# Patient Record
Sex: Female | Born: 1970 | Race: White | Hispanic: No | Marital: Single | Smoking: Current every day smoker
Health system: Southern US, Community
[De-identification: ages and names within clinical notes are randomized; demographics above are authoritative.]

## PROBLEM LIST (undated history)

## (undated) DIAGNOSIS — F32A Depression, unspecified: Secondary | ICD-10-CM

## (undated) DIAGNOSIS — K30 Functional dyspepsia: Secondary | ICD-10-CM

## (undated) DIAGNOSIS — H9190 Unspecified hearing loss, unspecified ear: Secondary | ICD-10-CM

## (undated) DIAGNOSIS — M1990 Unspecified osteoarthritis, unspecified site: Secondary | ICD-10-CM

## (undated) DIAGNOSIS — K219 Gastro-esophageal reflux disease without esophagitis: Secondary | ICD-10-CM

## (undated) DIAGNOSIS — K529 Noninfective gastroenteritis and colitis, unspecified: Secondary | ICD-10-CM

## (undated) DIAGNOSIS — F329 Major depressive disorder, single episode, unspecified: Secondary | ICD-10-CM

## (undated) DIAGNOSIS — E785 Hyperlipidemia, unspecified: Secondary | ICD-10-CM

## (undated) DIAGNOSIS — R9389 Abnormal findings on diagnostic imaging of other specified body structures: Secondary | ICD-10-CM

## (undated) DIAGNOSIS — F419 Anxiety disorder, unspecified: Secondary | ICD-10-CM

## (undated) DIAGNOSIS — K859 Acute pancreatitis without necrosis or infection, unspecified: Secondary | ICD-10-CM

## (undated) DIAGNOSIS — L309 Dermatitis, unspecified: Secondary | ICD-10-CM

## (undated) DIAGNOSIS — K589 Irritable bowel syndrome without diarrhea: Secondary | ICD-10-CM

## (undated) HISTORY — DX: Major depressive disorder, single episode, unspecified: F32.9

## (undated) HISTORY — PX: ANTERIOR CRUCIATE LIGAMENT REPAIR: SHX115

## (undated) HISTORY — DX: Hyperlipidemia, unspecified: E78.5

## (undated) HISTORY — DX: Anxiety disorder, unspecified: F41.9

## (undated) HISTORY — DX: Depression, unspecified: F32.A

## (undated) HISTORY — DX: Gastro-esophageal reflux disease without esophagitis: K21.9

## (undated) HISTORY — DX: Unspecified osteoarthritis, unspecified site: M19.90

## (undated) HISTORY — DX: Acute pancreatitis without necrosis or infection, unspecified: K85.90

---

## 2007-09-11 ENCOUNTER — Encounter: Payer: Self-pay | Admitting: Family Medicine

## 2010-03-18 ENCOUNTER — Ambulatory Visit: Payer: Self-pay | Admitting: Family Medicine

## 2010-03-18 ENCOUNTER — Other Ambulatory Visit: Admission: RE | Admit: 2010-03-18 | Discharge: 2010-03-18 | Payer: Self-pay | Admitting: Family Medicine

## 2010-03-18 DIAGNOSIS — M2550 Pain in unspecified joint: Secondary | ICD-10-CM

## 2010-03-18 DIAGNOSIS — M1990 Unspecified osteoarthritis, unspecified site: Secondary | ICD-10-CM | POA: Insufficient documentation

## 2010-03-18 DIAGNOSIS — F329 Major depressive disorder, single episode, unspecified: Secondary | ICD-10-CM

## 2010-03-18 DIAGNOSIS — H6120 Impacted cerumen, unspecified ear: Secondary | ICD-10-CM | POA: Insufficient documentation

## 2010-03-18 DIAGNOSIS — H9190 Unspecified hearing loss, unspecified ear: Secondary | ICD-10-CM | POA: Insufficient documentation

## 2010-03-18 DIAGNOSIS — K219 Gastro-esophageal reflux disease without esophagitis: Secondary | ICD-10-CM | POA: Insufficient documentation

## 2010-03-18 DIAGNOSIS — F411 Generalized anxiety disorder: Secondary | ICD-10-CM | POA: Insufficient documentation

## 2010-03-18 DIAGNOSIS — H538 Other visual disturbances: Secondary | ICD-10-CM

## 2010-03-19 LAB — CONVERTED CEMR LAB
Bilirubin, Direct: 0.1 mg/dL (ref 0.0–0.3)
Calcium: 9.5 mg/dL (ref 8.4–10.5)
Chloride: 106 meq/L (ref 96–112)
Cholesterol: 257 mg/dL — ABNORMAL HIGH (ref 0–200)
Creatinine, Ser: 0.7 mg/dL (ref 0.4–1.2)
Total Bilirubin: 0.3 mg/dL (ref 0.3–1.2)
Total CHOL/HDL Ratio: 8
VLDL: 124.2 mg/dL — ABNORMAL HIGH (ref 0.0–40.0)

## 2010-03-25 ENCOUNTER — Encounter: Payer: Self-pay | Admitting: Family Medicine

## 2010-03-25 LAB — CONVERTED CEMR LAB: Pap Smear: NEGATIVE

## 2010-05-06 ENCOUNTER — Telehealth (INDEPENDENT_AMBULATORY_CARE_PROVIDER_SITE_OTHER): Payer: Self-pay | Admitting: *Deleted

## 2010-05-06 ENCOUNTER — Ambulatory Visit: Payer: Self-pay | Admitting: Family Medicine

## 2010-05-06 DIAGNOSIS — E781 Pure hyperglyceridemia: Secondary | ICD-10-CM | POA: Insufficient documentation

## 2010-05-07 LAB — CONVERTED CEMR LAB
AST: 20 units/L (ref 0–37)
Alkaline Phosphatase: 47 units/L (ref 39–117)
Total Bilirubin: 0.5 mg/dL (ref 0.3–1.2)
Total CHOL/HDL Ratio: 6
VLDL: 89.6 mg/dL — ABNORMAL HIGH (ref 0.0–40.0)

## 2010-05-12 ENCOUNTER — Telehealth: Payer: Self-pay | Admitting: Family Medicine

## 2010-06-09 NOTE — Letter (Signed)
Summary: Results Follow up Letter  [facility]  [address] [place], [place] [address]   Phone: [phone]  Fax: [phone]    03/25/2010 MRN: [id number]  Sherri Dalton [address] [place], [place]  [address]  Dear Ms. Bentson,  The following are the results of your recent test(s):  Test         Result    Pap Smear:        Normal __X___  Not Normal _____ Comments: ______________________________________________________ Cholesterol: LDL(Bad cholesterol):         Your goal is less than:         HDL (Good cholesterol):       Your goal is more than: Comments:  ______________________________________________________ Mammogram:        Normal _____  Not Normal _____ Comments:  ___________________________________________________________________ Hemoccult:        Normal _____  Not normal _______ Comments:    _____________________________________________________________________ Other Tests:    We routinely do not discuss normal results over the telephone.  If you desire a copy of the results, or you have any questions about this information we can discuss them at your next office visit.   Sincerely,      Dr. Ruthe Mannan

## 2010-06-09 NOTE — Assessment & Plan Note (Signed)
Summary: NEW PT TO EST/CPX/CLE   Vital Signs:  Patient profile:   40 year old female Height:      64 inches Weight:      173 pounds BMI:     29.80 Temp:     98.6 degrees F oral Pulse rate:   72 / minute Pulse rhythm:   regular BP sitting:   102 / 70  (right arm) Cuff size:   regular  Vitals Entered By: Linde Gillis CMA Duncan Dull) (March 18, 2010 9:34 AM) CC: new patient, establish care   History of Present Illness: 40 yo here to establish care.  Hearing loss- noticed bilateral hearing loss, getting progressively worse over past 2 years, right >left.  No ringing in ears, ear pain, drainage, fevers or chills.  Does work around loud noises.  Blurred vision- at times, when she changes position quickl, notices blurred vision, dizziness, and nausea.  Resolves on its own within miinutes.  Arthritis- multiple sites due to multiple sports related injuries- hands, knees bilateral, back.  Takes Diclofenac as needed.  Herpes labialis- uses Valtrex as needed, has not had an outbreak in months.   Depression/anxiety- has been on Wellbutrin 300 mg since 1999.  Symptoms well controlled.  No tearfulness, has more energy. No SI or HI.  Well woman- G0 by mouth.  No h/o abrnormal pap smears.    Preventive Screening-Counseling & Management  Alcohol-Tobacco     Smoking Status: current     Smoking Cessation Counseling: yes      Drug Use:  no.    Allergies (verified): No Known Drug Allergies  Past History:  Past Medical History: Anxiety Depression GERD Osteoarthritis  Past Surgical History: ACL left repaired  Family History: Dad died at 25 of prostate CA and cirrhosis of liver.    Social History: Current Smoker Alcohol use-no Drug use-no Smoking Status:  current Drug Use:  no  Review of Systems      See HPI General:  Denies fever and malaise. Eyes:  Complains of blurring; denies discharge, double vision, eye irritation, eye pain, and halos. ENT:  Complains of decreased  hearing; denies difficulty swallowing, ear discharge, and earache. CV:  Denies chest pain or discomfort. Resp:  Denies shortness of breath. GI:  Denies abdominal pain, bloody stools, and change in bowel habits. GU:  Denies abnormal vaginal bleeding, dysuria, genital sores, urinary frequency, and urinary hesitancy. MS:  Complains of joint pain; denies joint redness and joint swelling. Derm:  Denies rash. Neuro:  Denies headaches and numbness. Psych:  Denies panic attacks, sense of great danger, suicidal thoughts/plans, thoughts of violence, unusual visions or sounds, and thoughts /plans of harming others. Endo:  Denies cold intolerance and heat intolerance. Heme:  Denies abnormal bruising and bleeding.  Physical Exam  General:  Well-developed,well-nourished,in no acute distress; alert,appropriate and cooperative throughout examination Head:  normocephalic, atraumatic, and no abnormalities observed.   Eyes:  vision grossly intact, pupils equal, pupils round, and pupils reactive to light.   neg dix hallpike Ears:  cerumen impaction bilaterally- removed with irrigation and metal currette, TMs normal after removal, hearing much improved.   Nose:  no external deformity.   Mouth:  good dentition.   Neck:  No deformities, masses, or tenderness noted. Breasts:  No mass, nodules, thickening, tenderness, bulging, retraction, inflamation, nipple discharge or skin changes noted.   Lungs:  Normal respiratory effort, chest expands symmetrically. Lungs are clear to auscultation, no crackles or wheezes. Heart:  Normal rate and regular rhythm. S1  and S2 normal without gallop, murmur, click, rub or other extra sounds. Abdomen:  Bowel sounds positive,abdomen soft and non-tender without masses, organomegaly or hernias noted. Rectal:  no external abnormalities.   Genitalia:  Pelvic Exam:        External: normal female genitalia without lesions or masses        Vagina: normal without lesions or masses, some  thick white discharge in vault        Cervix: normal without lesions or masses        Adnexa: normal bimanual exam without masses or fullness        Uterus: normal by palpation        Pap smear: performed Msk:  No deformity or scoliosis noted of thoracic or lumbar spine.   Extremities:  no edema Neurologic:  alert & oriented X3 and gait normal.   Skin:  Intact without suspicious lesions or rashes Psych:  Cognition and judgment appear intact. Alert and cooperative with normal attention span and concentration. No apparent delusions, illusions, hallucinations   Impression & Recommendations:  Problem # 1:  HEARING LOSS, BILATERAL (ICD-389.9) Assessment New Lkely due to cerumen impaction, greatly improved after cerumen removal. ADvised her that if hearing worsens again, to return and we will refer to audiology.  Problem # 2:  CERUMEN IMPACTION, BILATERAL (ICD-380.4) Assessment: New See above. Orders: Cerumen Impaction Removal (81191)  Problem # 3:  PREVENTIVE HEALTH CARE (ICD-V70.0) Reviewed preventive care protocols, scheduled due services, and updated immunizations Discussed nutrition, exercise, diet, and healthy lifestyle.  Pap today. BMET, FLP today. Tdap and influenza vaccination today. Orders: Venipuncture (47829) TLB-BMP (Basic Metabolic Panel-BMET) (80048-METABOL)  Problem # 4:  DEPRESSION (ICD-311) Assessment: Unchanged Stable on Wellbutrin 300 mg daily.   Her updated medication list for this problem includes:    Wellbutrin Xl 300 Mg Xr24h-tab (Bupropion hcl) .Marland Kitchen... Take one tablet by mouth daily  Problem # 5:  BLURRED VISION, INTERMITTENT (ICD-368.8) Assessment: New ? vertigo worsened by cerumen impaciton.  Hopefully will improve now that large amounts of cerumen removed. Still advised to see optometrist for dilated eye exam.  Complete Medication List: 1)  Wellbutrin Xl 300 Mg Xr24h-tab (Bupropion hcl) .... Take one tablet by mouth daily 2)  Diclofenac Sodium 50  Mg Tbec (Diclofenac sodium) .... Take 150mg  two times a day 3)  Valacyclovir Hcl 500 Mg Tabs (Valacyclovir hcl) .... 1000mg  as needed 4)  Epipen 2-pak 0.3 Mg/0.58ml Devi (Epinephrine) .... Use as directed  Other Orders: TLB-Lipid Panel (80061-LIPID) TLB-Hepatic/Liver Function Pnl (80076-HEPATIC)  Patient Instructions: 1)  Great to meet you. 2)  Please make an appointment with an eye doctor for a dilated eye exam. Prescriptions: EPIPEN 2-PAK 0.3 MG/0.3ML DEVI (EPINEPHRINE) use as directed  #1 x 1   Entered and Authorized by:   Ruthe Mannan MD   Signed by:   Ruthe Mannan MD on 03/18/2010   Method used:   Print then Give to Patient   RxID:   [id number] VALACYCLOVIR HCL 500 MG TABS (VALACYCLOVIR HCL) 1000mg  as needed  #30 x 0   Entered and Authorized by:   Ruthe Mannan MD   Signed by:   Ruthe Mannan MD on 03/18/2010   Method used:   Print then Give to Patient   RxID:   [id number] DICLOFENAC SODIUM 50 MG TBEC (DICLOFENAC SODIUM) take 150mg  two times a day  #60 x 6   Entered and Authorized by:   Ruthe Mannan MD   Signed by:   Ruthe Mannan  MD on 03/18/2010   Method used:   Print then Give to Patient   RxID:   [id number] WELLBUTRIN XL 300 MG XR24H-TAB (BUPROPION HCL) take one tablet by mouth daily  #30 x 6   Entered and Authorized by:   Ruthe Mannan MD   Signed by:   Ruthe Mannan MD on 03/18/2010   Method used:   Print then Give to Patient   RxID:   [id number]    Orders Added: 1)  TLB-Lipid Panel [80061-LIPID] 2)  Venipuncture [84696] 3)  TLB-BMP (Basic Metabolic Panel-BMET) [80048-METABOL] 4)  TLB-Hepatic/Liver Function Pnl [80076-HEPATIC] 5)  Cerumen Impaction Removal [69210] 6)  Est. Patient Level III [29528] 7)  New Patient 18-39 years [99385]    Current Allergies (reviewed today): No known allergies    Prevention & Chronic Care Immunizations   Influenza vaccine: given  (03/18/2010)   Influenza vaccine due: 03/19/2011    Tetanus booster: 03/18/2010: given    Tetanus booster due: 03/18/2020    Pneumococcal vaccine: Not documented  Other Screening   Pap smear: Not documented   Pap smear action/deferral: Ordered  (03/18/2010)   Smoking status: current  (03/18/2010)   Smoking cessation counseling: yes  (03/18/2010)  Lipids   Total Cholesterol: Not documented   Lipid panel action/deferral: Lipid Panel ordered   LDL: Not documented   LDL Direct: Not documented   HDL: Not documented   Triglycerides: Not documented   Nursing Instructions: Give Flu vaccine today Give tetanus booster today Pap smear today   Flu Vaccine Result Date:  03/18/2010 Flu Vaccine Result:  given TD Result Date:  03/18/2010 TD Result:  given   Appended Document: NEW PT TO EST/CPX/CLE     Allergies: No Known Drug Allergies   Complete Medication List: 1)  Wellbutrin Xl 300 Mg Xr24h-tab (Bupropion hcl) .... Take one tablet by mouth daily 2)  Diclofenac Sodium 50 Mg Tbec (Diclofenac sodium) .... Take 150mg  two times a day 3)  Valacyclovir Hcl 500 Mg Tabs (Valacyclovir hcl) .... 1000mg  as needed 4)  Epipen 2-pak 0.3 Mg/0.65ml Devi (Epinephrine) .... Use as directed  Other Orders: Tdap => 47yrs IM (41324) Admin 1st Vaccine (40102) Flu Vaccine 44yrs + [phone])   Orders Added: 1)  Tdap => 4yrs IM [90715] 2)  Admin 1st Vaccine [90471] 3)  Flu Vaccine 66yrs + [64403]   Immunizations Administered:  Tetanus Vaccine:    Vaccine Type: Tdap    Site: left deltoid    Mfr: GlaxoSmithKline    Dose: 0.5 ml    Route: IM    Given by: Linde Gillis CMA (AAMA)    Exp. Date: 02/27/2012    Lot #: [id number]    VIS given: 03/27/08 version given March 18, 2010.   Immunizations Administered:  Tetanus Vaccine:    Vaccine Type: Tdap    Site: left deltoid    Mfr: GlaxoSmithKline    Dose: 0.5 ml    Route: IM    Given by: Linde Gillis CMA (AAMA)    Exp. Date: 02/27/2012    Lot #: [id number]    VIS given: 03/27/08 version given March 18, 2010.  Flu  Vaccine Consent Questions     Do you have a history of severe allergic reactions to this vaccine? no    Any prior history of allergic reactions to egg and/or gelatin? no    Do you have a sensitivity to the preservative Thimersol? no    Do you have a past history of Guillan-Barre Syndrome? no  Do you currently have an acute febrile illness? no    Have you ever had a severe reaction to latex? no    Vaccine information given and explained to patient? yes    Are you currently pregnant? no    Lot Number:[id number]   Exp Date:11/07/2010   Site Given  Left Deltoid IMu1

## 2010-06-11 NOTE — Progress Notes (Signed)
Summary: Med change  Phone Note Outgoing Call Call back at [facility] Phone [phone]   Call placed by: DeShannon Smith CMA Duncan Dull),  May 12, 2010 12:17 PM Call placed to: Patient Summary of Call: calling pt to advise that refill request for Simvastatin has been changed to Tricor and that refill was sent in 05/06/2010 Initial call taken by: Mervin Hack CMA Duncan Dull),  May 12, 2010 12:18 PM  Follow-up for Phone Call        Left message on cell phone for patient to return call.  DeShannon Smith CMA Duncan Dull)  May 12, 2010 12:18 PM   Patient advised as instructed via message left on cell phone voicemail.  Simvastatin changed to Tricor and Rx for Tricor has already been sent to pharmacy. Follow-up by: Linde Gillis CMA Duncan Dull),  May 13, 2010 8:59 AM

## 2010-06-11 NOTE — Assessment & Plan Note (Signed)
Summary: 6 WEEK FOLLOW UP/NT R/S FROM 12/27   Vital Signs:  Patient profile:   40 year old female Height:      64 inches Weight:      180.75 pounds BMI:     31.14 Temp:     98.8 degrees F oral Pulse rate:   80 / minute Pulse rhythm:   regular BP sitting:   120 / 80  (right arm) Cuff size:   regular  Vitals Entered By: Linde Gillis CMA Duncan Dull) (May 06, 2010 7:53 AM) CC: follow up triglycerides   History of Present Illness: 40 yo here for follow hypertiriglyceridemia.  On 04-01-23, TG wwere 621, HDL 32, LDL 199.  Started Simvastatin 40 mg daily with no noticible side effects.  No myalgias or weakness.  Has cut back on  added sugars, eliminate trans fats, increase fiber and limit alcohol.    Had some breaks in her diet since the this is the holidays but overall doing very well.   Current Medications (verified): 1)  Wellbutrin Xl 300 Mg Xr24h-Tab (Bupropion Hcl) .... Take One Tablet By Mouth Daily 2)  Diclofenac Sodium 50 Mg Tbec (Diclofenac Sodium) .... Take 150mg  Two Times A Day 3)  Valacyclovir Hcl 500 Mg Tabs (Valacyclovir Hcl) .... 1000mg  As Needed 4)  Epipen 2-Pak 0.3 Mg/0.21ml Devi (Epinephrine) .... Use As Directed 5)  Simvastatin 40 Mg Tabs (Simvastatin) .... Take One Tablet By Mouth Daily  Allergies (verified): No Known Drug Allergies  Past History:  Past Medical History: Last updated: 03/31/2010 Anxiety Depression GERD Osteoarthritis  Past Surgical History: Last updated: March 31, 2010 ACL left repaired  Family History: Last updated: 03/31/10 Dad died at 16 of prostate CA and cirrhosis of liver.    Social History: Last updated: Mar 31, 2010 Current Smoker Alcohol use-no Drug use-no  Risk Factors: Smoking Status: current (Mar 31, 2010)  Review of Systems      See HPI General:  Denies malaise. CV:  Denies chest pain or discomfort. MS:  Denies muscle weakness, stiffness, and thoracic pain.  Physical Exam  General:  Well-developed,well-nourished,in  no acute distress; alert,appropriate and cooperative throughout examination Lungs:  Normal respiratory effort, chest expands symmetrically. Lungs are clear to auscultation, no crackles or wheezes. Heart:  Normal rate and regular rhythm. S1 and S2 normal without gallop, murmur, click, rub or other extra sounds. Extremities:  no edema Neurologic:  alert & oriented X3 and gait normal.   Psych:  Cognition and judgment appear intact. Alert and cooperative with normal attention span and concentration. No apparent delusions, illusions, hallucinations   Impression & Recommendations:  Problem # 1:  HYPERTRIGLYCERIDEMIA (ICD-272.1) Assessment New Recheck lipid panel today, encouraged continued lifestyle changes. Orders: TLB-Hepatic/Liver Function Pnl (80076-HEPATIC)  Her updated medication list for this problem includes:    Simvastatin 40 Mg Tabs (Simvastatin) .Marland Kitchen... Take one tablet by mouth daily  Problem # 2:  ANTIHYPERLIPIDEMIC USE, LONG TERM (ICD-V58.69) Liver function test today. Orders: TLB-Hepatic/Liver Function Pnl (80076-HEPATIC)  Complete Medication List: 1)  Wellbutrin Xl 300 Mg Xr24h-tab (Bupropion hcl) .... Take one tablet by mouth daily 2)  Diclofenac Sodium 50 Mg Tbec (Diclofenac sodium) .... Take 150mg  two times a day 3)  Valacyclovir Hcl 500 Mg Tabs (Valacyclovir hcl) .... 1000mg  as needed 4)  Epipen 2-pak 0.3 Mg/0.34ml Devi (Epinephrine) .... Use as directed 5)  Simvastatin 40 Mg Tabs (Simvastatin) .... Take one tablet by mouth daily  Other Orders: Venipuncture (04540)   Orders Added: 1)  Venipuncture [98119] 2)  TLB-Hepatic/Liver Function Pnl [80076-HEPATIC] 3)  Est. Patient Level III [04540]    Current Allergies (reviewed today): No known allergies

## 2010-06-11 NOTE — Progress Notes (Signed)
----   Converted from flag ---- ---- 05/06/2010 2:18 PM, Ruthe Mannan MD wrote: please add lipid panel to her labs ------------------------------

## 2011-05-11 DIAGNOSIS — K859 Acute pancreatitis without necrosis or infection, unspecified: Secondary | ICD-10-CM

## 2011-05-11 HISTORY — DX: Acute pancreatitis without necrosis or infection, unspecified: K85.90

## 2011-05-29 ENCOUNTER — Encounter (HOSPITAL_COMMUNITY): Payer: Self-pay

## 2011-05-29 ENCOUNTER — Emergency Department (HOSPITAL_COMMUNITY): Payer: BC Managed Care – PPO

## 2011-05-29 ENCOUNTER — Emergency Department (HOSPITAL_COMMUNITY)
Admission: EM | Admit: 2011-05-29 | Discharge: 2011-05-29 | Disposition: A | Discharge status: Home / Self Care | Payer: BC Managed Care – PPO | Attending: Emergency Medicine | Admitting: Emergency Medicine

## 2011-05-29 DIAGNOSIS — K859 Acute pancreatitis without necrosis or infection, unspecified: Secondary | ICD-10-CM

## 2011-05-29 DIAGNOSIS — E781 Pure hyperglyceridemia: Secondary | ICD-10-CM | POA: Insufficient documentation

## 2011-05-29 DIAGNOSIS — K589 Irritable bowel syndrome without diarrhea: Secondary | ICD-10-CM | POA: Insufficient documentation

## 2011-05-29 DIAGNOSIS — F17200 Nicotine dependence, unspecified, uncomplicated: Secondary | ICD-10-CM | POA: Insufficient documentation

## 2011-05-29 HISTORY — DX: Irritable bowel syndrome, unspecified: K58.9

## 2011-05-29 HISTORY — DX: Functional dyspepsia: K30

## 2011-05-29 HISTORY — DX: Noninfective gastroenteritis and colitis, unspecified: K52.9

## 2011-05-29 LAB — CBC
MCH: 31.4 pg (ref 26.0–34.0)
MCHC: 35.5 g/dL (ref 30.0–36.0)
MCV: 88.3 fL (ref 78.0–100.0)
Platelets: 302 10*3/uL (ref 150–400)

## 2011-05-29 LAB — DIFFERENTIAL
Basophils Relative: 0 % (ref 0–1)
Eosinophils Absolute: 0.3 10*3/uL (ref 0.0–0.7)
Eosinophils Relative: 1 % (ref 0–5)
Lymphs Abs: 1.2 10*3/uL (ref 0.7–4.0)
Neutrophils Relative %: 89 % — ABNORMAL HIGH (ref 43–77)

## 2011-05-29 LAB — COMPREHENSIVE METABOLIC PANEL
Albumin: 3.8 g/dL (ref 3.5–5.2)
Alkaline Phosphatase: 56 U/L (ref 39–117)
BUN: 13 mg/dL (ref 6–23)
Calcium: 9.1 mg/dL (ref 8.4–10.5)
GFR calc Af Amer: >90 mL/min (ref >90)
Glucose, Bld: 80 mg/dL (ref 70–99)
Potassium: 4.1 mEq/L (ref 3.5–5.1)
Sodium: 136 mEq/L (ref 135–145)
Total Protein: 7.4 g/dL (ref 6.0–8.3)

## 2011-05-29 LAB — URINALYSIS, ROUTINE W REFLEX MICROSCOPIC
Nitrite: NEGATIVE
Specific Gravity, Urine: 1.025 (ref 1.005–1.030)
Urobilinogen, UA: 0.2 mg/dL (ref 0.0–1.0)
pH: 5.5 (ref 5.0–8.0)

## 2011-05-29 LAB — URINE MICROSCOPIC-ADD ON

## 2011-05-29 LAB — PREGNANCY, URINE: Preg Test, Ur: NEGATIVE

## 2011-05-29 LAB — LIPASE, BLOOD: Lipase: 240 U/L — ABNORMAL HIGH (ref 11–59)

## 2011-05-29 MED ORDER — ONDANSETRON HCL 4 MG/2ML IJ SOLN
INTRAMUSCULAR | Status: AC
  Administered 2011-05-29: 15:00:00
  Filled 2011-05-29: qty 2

## 2011-05-29 MED ORDER — IOHEXOL 300 MG/ML  SOLN
100.0000 mL | Freq: Once | INTRAMUSCULAR | Status: AC | PRN
  Administered 2011-05-29: 100 mL via INTRAVENOUS

## 2011-05-29 MED ORDER — ONDANSETRON HCL 4 MG/2ML IJ SOLN
4.0000 mg | Freq: Once | INTRAMUSCULAR | Status: AC
  Administered 2011-05-29: 4 mg via INTRAVENOUS
  Filled 2011-05-29: qty 2

## 2011-05-29 MED ORDER — ONDANSETRON 8 MG PO TBDP: 8.0000 mg | ORAL_TABLET | Freq: Three times a day (TID) | ORAL | Status: AC | PRN

## 2011-05-29 MED ORDER — OXYCODONE-ACETAMINOPHEN 5-325 MG PO TABS: 1.0000 | ORAL_TABLET | ORAL | Status: DC | PRN

## 2011-05-29 MED ORDER — SODIUM CHLORIDE 0.9 % IV BOLUS (SEPSIS)
1000.0000 mL | Freq: Once | INTRAVENOUS | Status: AC
  Administered 2011-05-29: 1000 mL via INTRAVENOUS

## 2011-05-29 MED ORDER — PANTOPRAZOLE SODIUM 40 MG IV SOLR
40.0000 mg | Freq: Once | INTRAVENOUS | Status: AC
  Administered 2011-05-29: 40 mg via INTRAVENOUS
  Filled 2011-05-29: qty 40

## 2011-05-29 NOTE — ED Notes (Signed)
R given to pt.family at bedside

## 2011-05-29 NOTE — ED Provider Notes (Addendum)
History     CSN: [id number]  Arrival date & time 05/29/11  1455   First MD Initiated Contact with Patient 05/29/11 1548      Chief Complaint  Patient presents with  . Abdominal Pain  . Nausea  . Diarrhea    chronic    (Consider location/radiation/quality/duration/timing/severity/associated sxs/prior treatment) HPI  Patient with complaints of n/v/ today.  She had a similar episode last Sunday.She describes diffuse burning abdominal discomfort.  She has been eating well, she denies alcohol use.  She has a history of ibs.   She describes the emesis as mucous.    Past Medical History  Diagnosis Date  . Irritable bowel   . Indigestion   . Chronic diarrhea     Past Surgical History  Procedure Date  . Joint replacement     History reviewed. No pertinent family history.  History  Substance Use Topics  . Smoking status: Current Everyday Smoker -- 1.0 packs/day    Types: Cigarettes  . Smokeless tobacco: Not on file  . Alcohol Use: No    OB History    Grav Para Term Preterm Abortions TAB SAB Ect Mult Living                  Review of Systems  All other systems reviewed and are negative.    Allergies  Bee venom  Home Medications  No current outpatient prescriptions on file.  BP 121/68  Pulse 66  Temp(Src) 98.1 F (36.7 C) (Oral)  Resp 20  Ht 5\' 4"  (1.626 m)  Wt 169 lb (76.658 kg)  BMI 29.01 kg/m2  SpO2 99%  LMP 05/21/2011  Physical Exam  Nursing note and vitals reviewed. Constitutional: She is oriented to person, place, and time. She appears well-developed and well-nourished.  HENT:  Head: Normocephalic and atraumatic.  Eyes: Conjunctivae and EOM are normal. Pupils are equal, round, and reactive to light.  Neck: Normal range of motion. Neck supple.  Cardiovascular: Normal rate and regular rhythm.   Pulmonary/Chest: Effort normal and breath sounds normal.  Abdominal: Soft. Bowel sounds are normal.  Musculoskeletal: Normal range of motion.    Neurological: She is alert and oriented to person, place, and time.  Skin: Skin is warm and dry.  Psychiatric: She has a normal mood and affect.    ED Course  Procedures (including critical care time)  Labs Reviewed - No data to display No results found.   No diagnosis found. Results for orders placed during the hospital encounter of 05/29/11  CBC      Component Value Range   WBC 21.9 (*) 4.0 - 10.5 (K/uL)   RBC 4.78  3.87 - 5.11 (MIL/uL)   Hemoglobin 15.0  12.0 - 15.0 (g/dL)   HCT 40.9  81.1 - 91.4 (%)   MCV 88.3  78.0 - 100.0 (fL)   MCH 31.4  26.0 - 34.0 (pg)   MCHC 35.5  30.0 - 36.0 (g/dL)   RDW 78.2  95.6 - 21.3 (%)   Platelets 302  150 - 400 (K/uL)  DIFFERENTIAL      Component Value Range   Neutrophils Relative 89 (*) 43 - 77 (%)   Neutro Abs 19.4 (*) 1.7 - 7.7 (K/uL)   Lymphocytes Relative 5 (*) 12 - 46 (%)   Lymphs Abs 1.2  0.7 - 4.0 (K/uL)   Monocytes Relative 4  3 - 12 (%)   Monocytes Absolute 1.0  0.1 - 1.0 (K/uL)   Eosinophils Relative 1  0 -  5 (%)   Eosinophils Absolute 0.3  0.0 - 0.7 (K/uL)   Basophils Relative 0  0 - 1 (%)   Basophils Absolute 0.1  0.0 - 0.1 (K/uL)  COMPREHENSIVE METABOLIC PANEL      Component Value Range   Sodium 136  135 - 145 (mEq/L)   Potassium 4.1  3.5 - 5.1 (mEq/L)   Chloride 102  96 - 112 (mEq/L)   CO2 21  19 - 32 (mEq/L)   Glucose, Bld 80  70 - 99 (mg/dL)   BUN 13  6 - 23 (mg/dL)   Creatinine, Ser 1.61  0.50 - 1.10 (mg/dL)   Calcium 9.1  8.4 - 09.6 (mg/dL)   Total Protein 7.4  6.0 - 8.3 (g/dL)   Albumin 3.8  3.5 - 5.2 (g/dL)   AST 28  0 - 37 (U/L)   ALT 21  0 - 35 (U/L)   Alkaline Phosphatase 56  39 - 117 (U/L)   Total Bilirubin 0.2 (*) 0.3 - 1.2 (mg/dL)   GFR calc non Af Amer >90  >90 (mL/min)   GFR calc Af Amer >90  >90 (mL/min)  LIPASE, BLOOD      Component Value Range   Lipase 240 (*) 11 - 59 (U/L)  URINALYSIS, ROUTINE W REFLEX MICROSCOPIC      Component Value Range   Color, Urine YELLOW  YELLOW    APPearance  CLOUDY (*) CLEAR    Specific Gravity, Urine 1.025  1.005 - 1.030    pH 5.5  5.0 - 8.0    Glucose, UA NEGATIVE  NEGATIVE (mg/dL)   Hgb urine dipstick LARGE (*) NEGATIVE    Bilirubin Urine SMALL (*) NEGATIVE    Ketones, ur TRACE (*) NEGATIVE (mg/dL)   Protein, ur NEGATIVE  NEGATIVE (mg/dL)   Urobilinogen, UA 0.2  0.0 - 1.0 (mg/dL)   Nitrite NEGATIVE  NEGATIVE    Leukocytes, UA SMALL (*) NEGATIVE   PREGNANCY, URINE      Component Value Range   Preg Test, Ur NEGATIVE    URINE MICROSCOPIC-ADD ON      Component Value Range   Squamous Epithelial / LPF MANY (*) RARE    WBC, UA 0-2  <3 (WBC/hpf)   RBC / HPF 0-2  <3 (RBC/hpf)   Bacteria, UA RARE  RARE    Urine-Other MUCOUS PRESENT       MDM  Patient had ns and zofran and states she feels completely better.  Abdomen remains soft and nontender.  Discussed elevated wbc count- patient to return if pain.  Advised clear liquids.  Ct Abdomen Pelvis W Contrast  05/29/2011  *RADIOLOGY REPORT*  Clinical Data: Abdominal pain.  Elevated lipase  CT ABDOMEN AND PELVIS WITH CONTRAST  Technique:  Multidetector CT imaging of the abdomen and pelvis was performed following the standard protocol during bolus administration of intravenous contrast.  Contrast: OMNIPAQUE IOHEXOL 300 MG/ML IV SOLN  Comparison: None.  Findings: The lung bases are clear.  Heart size is normal.  There is no pleural or pericardial effusion.  There is mild diffuse fatty infiltration the liver.  No focal hepatic lesion or biliary ductal dilatation.  The spleen is normal in size and enhancement.  The adrenal glands and kidneys are normal.  The ureters are normal in caliber.  The pancreas is normal in size and enhancement. There is slight loss of the normal fat plane between the uncinate process and the duodenum, raising the question of slight peripancreatic inflammatory change.  No  definite evidence of duodenal wall thickening.  Remainder of the peripancreatic fat planes are preserved.   There is no lymphadenopathy, ascites, or free air.  The appendix is normal.  Bowel loops are normal in caliber.  Urinary bladder is within normal limits.  The uterus and adnexa are normal by CT. Abdominal aorta is normal in caliber.  A splenule noted within the splenic hilum.  Normal bones.  IMPRESSION:  1.  Subtle loss of the fat plane between the pancreatic uncinate process and duodenum.  In this patient with elevated lipase, this could reflect very mild changes of acute pancreatitis.  Otherwise, the remainder the pancreas and peripancreatic fat planes appear normal. 2.  Mild fatty infiltration of the liver. 3.  Normal appendix.  Original Report Authenticated By: Britta Mccreedy, M.D.   Patient continues to feel improved. She is having no pain and no vomiting care. She drank contrast without difficulty. Triglycerides have been sent. I suspect the pancreatitis is secondary to hypertriglyceridemia. She has a history of hypertriglyceridemia but has not been seen by Dr. for over a year and has not been taking any medications for this. She is advised to followup closely with her primary care Dr. on Monday. She is to return if she is worse anytime. Discharge diagnosis pancreatitis      Hilario Quarry, MD 05/29/11 2300  Hilario Quarry, MD 05/30/11 1610  Hilario Quarry, MD 06/02/11 [phone]

## 2011-05-29 NOTE — ED Notes (Signed)
MD at bedside. 

## 2011-05-29 NOTE — ED Notes (Signed)
Pt brought by ems from home with c/o abd pain/n/v/d since 1300 today.

## 2011-05-31 ENCOUNTER — Encounter: Payer: Self-pay | Admitting: Family Medicine

## 2011-06-01 ENCOUNTER — Encounter: Payer: Self-pay | Admitting: Family Medicine

## 2011-06-01 ENCOUNTER — Ambulatory Visit (INDEPENDENT_AMBULATORY_CARE_PROVIDER_SITE_OTHER): Payer: BC Managed Care – PPO | Admitting: Family Medicine

## 2011-06-01 VITALS — BP 116/76 | HR 53 | Temp 98.2°F | Wt 175.0 lb

## 2011-06-01 DIAGNOSIS — E781 Pure hyperglyceridemia: Secondary | ICD-10-CM

## 2011-06-01 DIAGNOSIS — K859 Acute pancreatitis without necrosis or infection, unspecified: Secondary | ICD-10-CM

## 2011-06-01 MED ORDER — GEMFIBROZIL 600 MG PO TABS: 600.0000 mg | ORAL_TABLET | Freq: Two times a day (BID) | ORAL | Status: DC

## 2011-06-01 NOTE — Progress Notes (Signed)
  Subjective:    Patient ID: Sherri Dalton, female    DOB: 11/02/1970, 41 y.o.   MRN: [id number]  HPI  41 yo here for hospital follow up.  Notes reviewed.  Seen at [facility] on 05/29/2011 for nausea and vomiting along with epigastric pain. Lipase elevated to 240.  Abdominal Ct- ? Mild pancreatitis, mild fatty infiltration of liver, normal appendix.  TG elevated to 1010.  Last saw her over a year ago, TG elevated to 448, lost to follow up after she started simvastatin 40 mg daily. Could no longer afford it, so stopped taking it.  Still having some epigastric discomfort but nausea has resolved.  Review of Systems See HPI No fevers. No vomiting. No SOB, no syncope.    Objective:   Physical Exam BP 116/76  Pulse 53  Temp(Src) 98.2 F (36.8 C) (Oral)  Wt 175 lb (79.379 kg)  SpO2 98%  LMP 05/21/2011  General:  Well-developed,well-nourished,in no acute distress; alert,appropriate and cooperative throughout examination Head:  normocephalic and atraumatic.   Eyes:  vision grossly intact, pupils equal, pupils round, and pupils reactive to light.   Ears:  R ear normal and L ear normal.   Nose:  no external deformity.   Mouth:  good dentition.   Neck:  No deformities, masses, or tenderness noted. Lungs:  Normal respiratory effort, chest expands symmetrically. Lungs are clear to auscultation, no crackles or wheezes. Heart:  Normal rate and regular rhythm. S1 and S2 normal without gallop, murmur, click, rub or other extra sounds. Abdomen:  Soft, mildly tender to palpation over epigastrium, otherwise non tender. Pos BS Msk:  No deformity or scoliosis noted of thoracic or lumbar spine.   Extremities:  No clubbing, cyanosis, edema, or deformity noted with normal full range of motion of all joints.   Neurologic:  alert & oriented X3 and gait normal.   Skin:  Intact without suspicious lesions or rashes Psych:  Cognition and judgment appear intact. Alert and cooperative with normal attention  span and concentration. No apparent delusions, illusions, hallucinations     Assessment & Plan:   1. Hypertriglyceridemia  Deteriorated.  Will start Gemfibrozil 600 mg twice daily. Recheck labs in 1 week. Lipid panel  2. Pancreatitis  New, secondary to hypertriglyceridemia. Will check lipase next week. Advised aggressive hydration and Gemfibrozil. See above. Lipase

## 2011-06-01 NOTE — Patient Instructions (Signed)
Good to see you. Please come back for a lab visit in 1 week.  Gemfibrozil tablets What is this medicine? GEMFIBROZIL (jem FI broe zil) can help lower blood fats and cholesterol for people who are at risk of getting inflammation of the pancreas (pancreatitis) from having very high amounts of fats in their blood. This medicine is only for patients whose blood fats are not controlled by diet. This medicine may be used for other purposes; ask your health care provider or pharmacist if you have questions. What should I tell my health care provider before I take this medicine? They need to know if you have any of these conditions: -cataracts -gallbladder disease -heart disease -kidney or liver disease -an unusual or allergic reaction to gemfibrozil, other medicines, foods, dyes, or preservatives -pregnant or trying to get pregnant -breast-feeding How should I use this medicine? Take this medicine by mouth with a glass of water. Follow the directions on the prescription label. Take this medicine 30 minutes before a meal. Take your doses at regular intervals. Do not take your medicine more often than directed. Do not stop taking this medicine except on the advice of your doctor or health care professional. Talk to your pediatrician regarding the use of this medicine in children. Special care may be needed. Overdosage: If you think you have taken too much of this medicine contact a poison control center or emergency room at once. NOTE: This medicine is only for you. Do not share this medicine with others. What if I miss a dose? If you miss a dose, take it as soon as you can. If it is almost time for your next dose, take only that dose. Do not take double or extra doses. What may interact with this medicine? Do not take this medicine with any of the following medications: -bexarotene -ezetimibe -other cholesterol medicines like clofibrate and fenofibrate -repaglinide -statin-type cholesterol  lowering drugs like atorvastatin, cerivastatin, fluvastatin, lovastatin, pravastatin, or simvastatin This medicine may also interact with the following medications: -warfarin -red yeast rice This list may not describe all possible interactions. Give your health care provider a list of all the medicines, herbs, non-prescription drugs, or dietary supplements you use. Also tell them if you smoke, drink alcohol, or use illegal drugs. Some items may interact with your medicine. What should I watch for while using this medicine? Visit your doctor or health care professional for regular checks on your progress. Your blood fats and other tests will be measured from time to time. This medicine is only part of a total cholesterol-lowering program. Your health care professional or dietician can suggest a low-cholesterol and low-fat diet that will reduce your risk of getting heart and blood vessel disease. Avoid alcohol and smoking, and keep a proper exercise schedule. If you are diabetic, close regulation and monitoring of your blood sugars can help your blood fat levels. This medicine may change the way your diabetic medicine works, and sometimes will require that your dosages be adjusted. Check with your doctor or health care professional. Sherri Dalton may get drowsy or dizzy. Do not drive, use machinery, or do anything that needs mental alertness until you know how this drug affects you. Do not stand or sit up quickly, especially if you are an older patient. This reduces the risk of dizzy or fainting spells. What side effects may I notice from receiving this medicine? Side effects that you should report to your doctor or health care professional as soon as possible: -cold or flu-like symptoms -  dark yellow or brown urine -lower back or side pain -muscle pain -pain or difficulty passing urine -stomach pain with nausea and vomiting -unusual tiredness -yellowing of the eyes or skin Side effects that usually do not  require medical attention (report to your doctor or health care professional if they continue or are bothersome): -blurred vision -decreased sexual function or desire -headache -stomach gas, heartburn This list may not describe all possible side effects. Call your doctor for medical advice about side effects. You may report side effects to FDA at 1-800-FDA-1088. Where should I keep my medicine? Keep out of the reach of children. Store at room temperature between 20 and 25 degrees C (68 and 77 degrees F). Protect from light and humidity. Throw away any unused medicine after the expiration date. NOTE: This sheet is a summary. It may not cover all possible information. If you have questions about this medicine, talk to your doctor, pharmacist, or health care provider.  2012, [employer]vier/Gold Standard. (09/06/2007 10:37:30 AM)

## 2011-06-03 ENCOUNTER — Other Ambulatory Visit: Payer: Self-pay

## 2011-06-03 MED ORDER — OXYCODONE-ACETAMINOPHEN 5-325 MG PO TABS: 1.0000 | ORAL_TABLET | ORAL | Status: AC | PRN

## 2011-06-03 NOTE — Telephone Encounter (Signed)
Patient advised as instructed via telephone, Rx ready for pick up will be left at front desk. 

## 2011-06-03 NOTE — Telephone Encounter (Signed)
Pt request refill on Percocet5-325 mg. Pt said she is better but still having upper abdominal pain. Pain level now is 5-6. Pt said she does not think she will need large quantity of med but does want to pick up rx today due to weather forecast. Pt can be reached at [phone].Please advise.

## 2011-06-08 ENCOUNTER — Other Ambulatory Visit (INDEPENDENT_AMBULATORY_CARE_PROVIDER_SITE_OTHER): Payer: BC Managed Care – PPO

## 2011-06-08 DIAGNOSIS — K859 Acute pancreatitis without necrosis or infection, unspecified: Secondary | ICD-10-CM

## 2011-06-08 DIAGNOSIS — E781 Pure hyperglyceridemia: Secondary | ICD-10-CM

## 2011-06-08 LAB — LIPID PANEL
Cholesterol: 273 mg/dL — ABNORMAL HIGH (ref 0–200)
HDL: 33.6 mg/dL — ABNORMAL LOW (ref >39.00)
Total CHOL/HDL Ratio: 8
Triglycerides: 181 mg/dL — ABNORMAL HIGH (ref 0.0–149.0)
VLDL: 36.2 mg/dL (ref 0.0–40.0)

## 2011-06-08 LAB — LIPASE: Lipase: 32 U/L (ref 11.0–59.0)

## 2011-06-18 ENCOUNTER — Telehealth: Payer: Self-pay | Admitting: Family Medicine

## 2011-06-18 ENCOUNTER — Ambulatory Visit (INDEPENDENT_AMBULATORY_CARE_PROVIDER_SITE_OTHER): Payer: BC Managed Care – PPO | Admitting: Family Medicine

## 2011-06-18 ENCOUNTER — Encounter: Payer: Self-pay | Admitting: Family Medicine

## 2011-06-18 VITALS — BP 102/72 | HR 61 | Temp 98.4°F | Wt 168.8 lb

## 2011-06-18 DIAGNOSIS — K859 Acute pancreatitis without necrosis or infection, unspecified: Secondary | ICD-10-CM

## 2011-06-18 DIAGNOSIS — R17 Unspecified jaundice: Secondary | ICD-10-CM

## 2011-06-18 LAB — CBC WITH DIFFERENTIAL/PLATELET
Eosinophils Absolute: 0.4 10*3/uL (ref 0.0–0.7)
HCT: 41.8 % (ref 36.0–46.0)
Hemoglobin: 14.4 g/dL (ref 12.0–15.0)
Lymphs Abs: 3.1 10*3/uL (ref 0.7–4.0)
MCH: 31 pg (ref 26.0–34.0)
Monocytes Absolute: 0.6 10*3/uL (ref 0.1–1.0)
Monocytes Relative: 5 % (ref 3–12)
Neutrophils Relative %: 63 % (ref 43–77)
RBC: 4.65 MIL/uL (ref 3.87–5.11)

## 2011-06-18 NOTE — Progress Notes (Signed)
Pancreatitis 1/13, ER eval and sent home.  Started on gemfibrozil for elevated TG.  Still on zofran and percocet for pancreatitis, rare use of either.  Off gemfibrozil since this AM.    Transient jaundice, noted first about 1 week ago.  Present for minutes to hours.  "It was noticeable, other people noted it."  Urine has looked normal.  No known jaundice before the pancreatitis.  Still with some pain, episodic, epigastric and occ in lateral portions of upper abd, but not lower abd.  Fatigued.    Father died from cirrhosis, drinking.  She doesn't drink.    PMH and SH reviewed  ROS: See HPI, otherwise noncontributory.  Meds, vitals, and allergies reviewed.   GEN: nad, alert and oriented HEENT: mucous membranes moist NECK: supple w/o LA CV: rrr.  PULM: ctab, no inc wob ABD: soft, +bs, minimally ttp in epigastrum w/o rebound EXT: no edema SKIN: no acute rash, no jaundice

## 2011-06-18 NOTE — Patient Instructions (Signed)
Stop the gemfibrozil.  If you have sudden increase in pain or jaundice that doesn't resolve, then you'll need to be seen by an MD, potentially at ER.   You can get your results through our phone system.  Follow the instructions on the blue card. I'll talk with Dr. Dayton Martes.

## 2011-06-18 NOTE — Telephone Encounter (Signed)
Pt has appt today

## 2011-06-18 NOTE — Telephone Encounter (Signed)
Triage Record Num: [id number] Operator: Aundra Millet Patient Name: Sherri Dalton Call Date & Time: 06/18/2011 8:27:42AM Patient Phone: [phone] PCP: Ruthe Mannan Patient Gender: Female PCP Fax : [phone] Patient DOB: [birth year] Practice Name: [facility] Reason for Call: Caller: Sekai/Patient is calling with a question about Lopid.The medication was written by Ruthe Mannan Nestor Ramp). LMP 06/17/2011 -- Pt seen in ED ~ 2-3 weeks for vomiting. abd pain and dx with pancreatitis b/c of high triglycerides. Dr Dayton Martes prescribed Lopid at FU appt ~ 2 weeks ago. Since starting Lopid has noticed episodes of yellowing skin - face, arms , hands. Sx's last 30 mins - 3-4 hrs and come and go. RN reached see in 24 hrs DISP for Jaundice and not previously evaluated -- RN sched appt for with Dr Para March at 1515 today/ Protocol(s) Used: Hepatitis, Diagnosed or Suspected Exposure Recommended Outcome per Protocol: See Provider within 24 hours Reason for Outcome: Jaundice and not previously evaluated Care Advice: Call provider if you develop vomiting, abdominal pain, or symptoms of dehydration (such as extreme thirst, dry mouth and tongue, dry skin, or restlessness/irritability). ~ ~ Avoid drinking alcohol until discussing with provider. If known or suspected infectious hepatitis, avoid close physical contact (such as kissing, shaking hands, sexual contact, or shared needles). Do not share personal care items, such as razors or toothbrushes. Do NOT share eating or drinking utensils or eat food prepared or handled by patient until evaluation and diagnosis by provider. ~ ~ SYMPTOM / CONDITION MANAGEMENT Use good handwashing, especially after toilet use or touching area. Keep fingernails short and clean. Keep hands away from your nose and mouth unless just washed. ~ Discontinue nonprescribed medications and complementary/alternative medications. Continue prescribed medication(s) at ordered  dosage/frequency until discussed with provider. ~ 06/18/2011 9:06:12AM Page 1 of 1 [id number]

## 2011-06-19 LAB — COMPREHENSIVE METABOLIC PANEL
Albumin: 4.8 g/dL (ref 3.5–5.2)
Alkaline Phosphatase: 60 U/L (ref 39–117)
BUN: 17 mg/dL (ref 6–23)
Creat: 0.81 mg/dL (ref 0.50–1.10)
Glucose, Bld: 82 mg/dL (ref 70–99)
Potassium: 4.4 mEq/L (ref 3.5–5.3)
Total Bilirubin: 0.4 mg/dL (ref 0.3–1.2)

## 2011-06-19 LAB — LIPASE: Lipase: 28 U/L (ref 0–75)

## 2011-06-20 ENCOUNTER — Encounter: Payer: Self-pay | Admitting: Family Medicine

## 2011-06-20 NOTE — Assessment & Plan Note (Signed)
Likely initially from TG elevation, now possibly from gemfibrozil.  Stop gemfibrozil, refer to GI, see notes on labs.  Okay for outpatient f/u.  D/w pt.  She agrees. >25 min spent with face to face with patient, >50% counseling and/or coordinating care.   I have d/w pt's PMD.

## 2011-06-30 ENCOUNTER — Encounter: Payer: Self-pay | Admitting: Gastroenterology

## 2011-06-30 ENCOUNTER — Ambulatory Visit (INDEPENDENT_AMBULATORY_CARE_PROVIDER_SITE_OTHER): Payer: BC Managed Care – PPO | Admitting: Gastroenterology

## 2011-06-30 ENCOUNTER — Other Ambulatory Visit (INDEPENDENT_AMBULATORY_CARE_PROVIDER_SITE_OTHER): Payer: BC Managed Care – PPO

## 2011-06-30 VITALS — BP 120/70 | HR 72 | Ht 64.0 in | Wt 168.0 lb

## 2011-06-30 DIAGNOSIS — K859 Acute pancreatitis without necrosis or infection, unspecified: Secondary | ICD-10-CM

## 2011-06-30 DIAGNOSIS — R1011 Right upper quadrant pain: Secondary | ICD-10-CM

## 2011-06-30 LAB — TRIGLYCERIDES: Triglycerides: 381 mg/dL — ABNORMAL HIGH (ref 0.0–149.0)

## 2011-06-30 LAB — COMPREHENSIVE METABOLIC PANEL
ALT: 20 U/L (ref 0–35)
CO2: 24 mEq/L (ref 19–32)
Calcium: 9.1 mg/dL (ref 8.4–10.5)
Chloride: 104 mEq/L (ref 96–112)
Creatinine, Ser: 0.8 mg/dL (ref 0.4–1.2)
GFR: 91.21 mL/min (ref >60.00)
Sodium: 139 mEq/L (ref 135–145)
Total Protein: 6.7 g/dL (ref 6.0–8.3)

## 2011-06-30 LAB — CBC WITH DIFFERENTIAL/PLATELET
Basophils Absolute: 0 10*3/uL (ref 0.0–0.1)
Lymphocytes Relative: 18.1 % (ref 12.0–46.0)
Monocytes Relative: 3.5 % (ref 3.0–12.0)
Neutrophils Relative %: 76 % (ref 43.0–77.0)
Platelets: 317 10*3/uL (ref 150.0–400.0)
RDW: 13.1 % (ref 11.5–14.6)

## 2011-06-30 LAB — AMYLASE: Amylase: 55 U/L (ref 27–131)

## 2011-06-30 NOTE — Progress Notes (Signed)
HPI: This is a     very pleasant 41 year old woman whom I am meeting for the first time today.  She had abd pain, elevated lipase, elevated WBC and peripancreatic edema.  All consistent with mild AP. Thought possibly due to known elevated triglycerides. Was started on lopid, then she noticed juandice "sometimes lasting just minutes."  Labs repeated showed normal bili, normal LFTs however.  This acute pancreatitis episode was vomiting and pain related.  Went to hosp, but did not require hosp admission.  She has had 'stomach problems her whole life with intermittent pains."  Has never had proven other episodes acute pancreatitis.  But she does have severe pains that last 2-3 days sometimes.    She swears she is becoming jaundiced lasting 15 minutes up to a few hours.  People have told her she is bright yellow.  She still has periodic vomiting and having intermittent pains.  These are acute episodes lasting 1-2 hours but then has residual pains that last a few days.  Overall she has lost 11 pounds in about a month.  IMPRESSION of CT  with IV and oral contrast scan 2-3 weeks ago:  1. Subtle loss of the fat plane between the pancreatic uncinate process and duodenum. In this patient with elevated lipase, this could reflect very mild changes of acute pancreatitis. Otherwise,  the remainder the pancreas and peripancreatic fat planes appear normal.  2. Mild fatty infiltration of the liver.  3. Normal appendix.  Bloodwork:  Liver tests were completely normal one week ago and also one month and one year ago. Her CBC was normal one week ago however one month ago her white blood cell count was 21,000. One month ago her lipase was somewhat elevated at 230.   One week ago her lipase was normal.  He she has had very high triglycerides in the past. One year ago her triglycerides were 620, one month ago at that time her lipase was elevated her triglycerides was 1040, last week her to close her eyes were 100-200  range.       Review of systems: Pertinent positive and negative review of systems were noted in the above HPI section. Complete review of systems was performed and was otherwise normal.    Past Medical History  Diagnosis Date  . Irritable bowel   . Indigestion   . Chronic diarrhea   . Anxiety   . Depression   . GERD (gastroesophageal reflux disease)   . Arthritis   . Pancreatitis   . Hyperlipidemia     Past Surgical History  Procedure Date  . Anterior cruciate ligament repair     Current Outpatient Prescriptions  Medication Sig Dispense Refill  . omeprazole (PRILOSEC OTC) 20 MG tablet Take 20 mg by mouth daily.      . ondansetron (ZOFRAN) 4 MG tablet Take 4 mg by mouth as needed.      . Oxycodone-Acetaminophen (PERCOCET PO) As needed      . pseudoephedrine-acetaminophen (TYLENOL SINUS) 30-500 MG TABS Take 1 tablet by mouth every 4 (four) hours as needed. For sinus relief        Allergies as of 06/30/2011 - Review Complete 06/30/2011  Allergen Reaction Noted  . Bee venom Swelling 05/29/2011    Family History  Problem Relation Age of Onset  . Prostate cancer Father   . Cirrhosis Father     alcohol use  . Breast cancer Paternal Aunt   . Crohn's disease Paternal Aunt   .  Diabetes Maternal Grandmother   . Diabetes Paternal Grandmother   . Diabetes Cousin   . Diabetes Paternal Aunt   . Diabetes Maternal Uncle   . Heart disease Maternal Grandfather   . Heart disease Maternal Uncle   . Heart disease Paternal Uncle   . Irritable bowel syndrome Paternal Aunt   . Irritable bowel syndrome Paternal Aunt   . Irritable bowel syndrome Paternal Aunt   . Irritable bowel syndrome Sister   . Prostate cancer Maternal Uncle   . Pancreatic cancer Maternal Uncle     History   Social History  . Marital Status: Single    Spouse Name: N/A    Number of Children: 0  . Years of Education: N/A   Occupational History  . survey tech    Social History Main Topics  . Smoking  status: Current Everyday Smoker -- 1.0 packs/day    Types: Cigarettes  . Smokeless tobacco: Never Used   Comment: tobacco handout given 2/20//2013  . Alcohol Use: No  . Drug Use: No  . Sexually Active: Not on file   Other Topics Concern  . Not on file   Social History Narrative  . No narrative on file       Physical Exam: LMP 06/17/2011 Constitutional: generally well-appearing Psychiatric: alert and oriented x3 Eyes: extraocular movements intact Mouth: oral pharynx moist, no lesions Neck: supple no lymphadenopathy Cardiovascular: heart regular rate and rhythm Lungs: clear to auscultation bilaterally Abdomen: soft,  mild epigastric tenderness , nondistended, no obvious ascites, no peritoneal signs, normal bowel sounds Extremities: no lower extremity edema bilaterally Skin: no lesions on visible extremities    Assessment and plan: 41 y.o. female with   elevated triglycerides, recent mild acute pancreatitis, persistent abdominal pain, subjective jaundice without lab test correlate  she clearly has elevated triglyceride levels and this is known to cause acute pancreatitis. Smoking has been implicated in acute pancreatitis as well. She had a CT scan abdomen and pelvis and the radiologist did not comment on her gallbladder. I reviewed those images personally and her gallbladder looks overall normal, however CT scan is not as sensitive as ultrasound to check for gallstones in the gallbladder. Perhaps she does have biliary stones which intermittently causes CBD obstruction, this could lead to jaundice. It would be unusual for jaundice to come and go as fast as she is reporting it "sometimes 15 minutes, sometimes 2-3 hours". She'll get a repeat set of labs today including CBC, pancreatic enzymes, complete metabolic profile.

## 2011-06-30 NOTE — Patient Instructions (Addendum)
One of your biggest health concerns is your smoking.  This increases your risk for most cancers and serious cardiovascular diseases such as strokes, heart attacks.  You should try your best to stop.  If you need assistance, please contact your PCP or Smoking Cessation Class at [facility] [phone]) or [facility] Quit-Line (1-800-QUIT-NOW). You will be set up for an ultrasound of RUQ to look for gallstones.  Please arrive at [facility] at 815 AM  on 07/01/11 and have nothing to eat or drink after midnight.  You will have labs checked today in the basement lab.  Please head down after you check out with the front desk  (cmet, amylase, lipase, cbc, triglycerides). A copy of this information will be made available to Dr. Dayton Martes.

## 2011-07-01 ENCOUNTER — Ambulatory Visit (HOSPITAL_COMMUNITY)
Admission: RE | Admit: 2011-07-01 | Discharge: 2011-07-01 | Disposition: A | Discharge status: Home / Self Care | Payer: BC Managed Care – PPO | Source: Ambulatory Visit | Attending: Gastroenterology | Admitting: Gastroenterology

## 2011-07-01 ENCOUNTER — Other Ambulatory Visit: Payer: Self-pay

## 2011-07-01 ENCOUNTER — Encounter: Payer: Self-pay | Admitting: Gastroenterology

## 2011-07-01 DIAGNOSIS — E782 Mixed hyperlipidemia: Secondary | ICD-10-CM

## 2011-07-01 DIAGNOSIS — R110 Nausea: Secondary | ICD-10-CM | POA: Insufficient documentation

## 2011-07-01 DIAGNOSIS — K859 Acute pancreatitis without necrosis or infection, unspecified: Secondary | ICD-10-CM

## 2011-07-01 DIAGNOSIS — R1011 Right upper quadrant pain: Secondary | ICD-10-CM

## 2011-07-01 DIAGNOSIS — K7689 Other specified diseases of liver: Secondary | ICD-10-CM | POA: Insufficient documentation

## 2011-07-05 ENCOUNTER — Other Ambulatory Visit (INDEPENDENT_AMBULATORY_CARE_PROVIDER_SITE_OTHER): Payer: BC Managed Care – PPO

## 2011-07-05 ENCOUNTER — Telehealth: Payer: Self-pay

## 2011-07-05 DIAGNOSIS — K859 Acute pancreatitis without necrosis or infection, unspecified: Secondary | ICD-10-CM

## 2011-07-05 DIAGNOSIS — E782 Mixed hyperlipidemia: Secondary | ICD-10-CM

## 2011-07-05 NOTE — Telephone Encounter (Signed)
Message copied by Donata Duff on Mon Jul 05, 2011  7:45 AM ------      Message from: Donata Duff      Created: Thu Jul 01, 2011 10:57 AM       Pt to get labs

## 2011-07-05 NOTE — Telephone Encounter (Signed)
Pt has been in for labs today

## 2011-07-06 ENCOUNTER — Telehealth: Payer: Self-pay | Admitting: Gastroenterology

## 2011-07-06 ENCOUNTER — Ambulatory Visit (AMBULATORY_SURGERY_CENTER): Payer: BC Managed Care – PPO | Admitting: Gastroenterology

## 2011-07-06 ENCOUNTER — Encounter: Payer: Self-pay | Admitting: Gastroenterology

## 2011-07-06 VITALS — BP 100/67 | HR 56 | Temp 98.9°F | Resp 27 | Ht 64.0 in | Wt 168.0 lb

## 2011-07-06 DIAGNOSIS — K859 Acute pancreatitis without necrosis or infection, unspecified: Secondary | ICD-10-CM

## 2011-07-06 MED ORDER — SODIUM CHLORIDE 0.9 % IV SOLN: 500.0000 mL | INTRAVENOUS | Status: DC

## 2011-07-06 NOTE — Progress Notes (Signed)
No complaints noted in the recovery room.  The pt and her mother had questions about labs ordered by Dr. Christella Hartigan.  Dr. Christella Hartigan had left the recovery room.  I advised them to call [facility] and ask to speak with Dr. Christella Hartigan.  They said they would. Maw  Patient did not experience any of the following events: a burn prior to discharge; a fall within the facility; wrong site/side/patient/procedure/implant event; or a hospital transfer or hospital admission upon discharge from the facility. [phone]) Patient did not have preoperative order for IV antibiotic SSI prophylaxis. [phone])

## 2011-07-06 NOTE — Telephone Encounter (Signed)
Message copied by Donata Duff on Tue Jul 06, 2011  3:31 PM ------      Message from: Rachael Fee      Created: Tue Jul 06, 2011  3:16 PM       Chaundra Abreu,            She and her mom requested general surgery referral to consider gb resection for recent pancreatitis. I'm not sure it will help but they are adamant.             Also can you have her get celiac panel and total IgA level checked            Thanks

## 2011-07-06 NOTE — Telephone Encounter (Signed)
Labs in [facility] and referral made.

## 2011-07-06 NOTE — Patient Instructions (Addendum)
Resume your prior medications today. Please call if any questions or concerns.    YOU HAD AN ENDOSCOPIC PROCEDURE TODAY AT THE  ENDOSCOPY CENTER: Refer to the procedure report that was given to you for any specific questions about what was found during the examination.  If the procedure report does not answer your questions, please call your gastroenterologist to clarify.  If you requested that your care partner not be given the details of your procedure findings, then the procedure report has been included in a sealed envelope for you to review at your convenience later.  YOU SHOULD EXPECT: Some feelings of bloating in the abdomen. Passage of more gas than usual.  Walking can help get rid of the air that was put into your GI tract during the procedure and reduce the bloating. If you had a lower endoscopy (such as a colonoscopy or flexible sigmoidoscopy) you may notice spotting of blood in your stool or on the toilet paper. If you underwent a bowel prep for your procedure, then you may not have a normal bowel movement for a few days.  DIET: Your first meal following the procedure should be a light meal and then it is ok to progress to your normal diet.  A half-sandwich or bowl of soup is an example of a good first meal.  Heavy or fried foods are harder to digest and may make you feel nauseous or bloated.  Likewise meals heavy in dairy and vegetables can cause extra gas to form and this can also increase the bloating.  Drink plenty of fluids but you should avoid alcoholic beverages for 24 hours.  ACTIVITY: Your care partner should take you home directly after the procedure.  You should plan to take it easy, moving slowly for the rest of the day.  You can resume normal activity the day after the procedure however you should NOT DRIVE or use heavy machinery for 24 hours (because of the sedation medicines used during the test).    SYMPTOMS TO REPORT IMMEDIATELY: A gastroenterologist can be reached at  any hour.  During normal business hours, 8:30 AM to 5:00 PM Monday through Friday, call [phone].  After hours and on weekends, please call the GI answering service at [phone] who will take a message and have the physician on call contact you.     Following upper endoscopy (EGD)  Vomiting of blood or coffee ground material  New chest pain or pain under the shoulder blades  Painful or persistently difficult swallowing  New shortness of breath  Fever of 100F or higher  Black, tarry-looking stools  FOLLOW UP: If any biopsies were taken you will be contacted by phone or by letter within the next 1-3 weeks.  Call your gastroenterologist if you have not heard about the biopsies in 3 weeks.  Our staff will call the home number listed on your records the next business day following your procedure to check on you and address any questions or concerns that you may have at that time regarding the information given to you following your procedure. This is a courtesy call and so if there is no answer at the home number and we have not heard from you through the emergency physician on call, we will assume that you have returned to your regular daily activities without incident.  SIGNATURES/CONFIDENTIALITY: You and/or your care partner have signed paperwork which will be entered into your electronic medical record.  These signatures attest to the fact that that   the information above on your After Visit Summary has been reviewed and is understood.  Full responsibility of the confidentiality of this discharge information lies with you and/or your care-partner.  

## 2011-07-06 NOTE — Op Note (Signed)
[facility] [address] [place], [place]  [address]  ENDOSCOPY PROCEDURE REPORT  PATIENT:  Sherri, Dalton  MR#:  [id number] BIRTHDATE:  January 29, 1971, 40 yrs. old  GENDER:  female ENDOSCOPIST:  Rachael Fee, MD Referred by:  Ruthe Mannan, M.D. PROCEDURE DATE:  07/06/2011 PROCEDURE:  EGD, diagnostic 43235 ASA CLASS:  Class II INDICATIONS:  recent mild acute pancreatitis; CT scan showed mild peripanc edema, panc enzymes elevated; US shows normal GB MEDICATIONS:   Fentanyl 50 mcg IV, These medications were titrated to patient response per physician's verbal order, Versed 7 mg IV TOPICAL ANESTHETIC:  Cetacaine Spray  DESCRIPTION OF PROCEDURE:   After the risks benefits and alternatives of the procedure were thoroughly explained, informed consent was obtained.  The LB [id number]-H180 [id number] endoscope was introduced through the mouth and advanced to the second portion of the duodenum, without limitations.  The instrument was slowly withdrawn as the mucosa was fully examined. <<PROCEDUREIMAGES>> The upper, middle, and distal third of the esophagus were carefully inspected and no abnormalities were noted. The z-line was well seen at the GEJ. The endoscope was pushed into the fundus which was normal including a retroflexed view. The antrum,gastric body, first and second part of the duodenum were unremarkable (see image1, image2, image3, image5, and image6).    Retroflexed views revealed no abnormalities.    The scope was then withdrawn from the patient and the procedure completed. COMPLICATIONS:  None  ENDOSCOPIC IMPRESSION: 1) Normal EGD  RECOMMENDATIONS: Follow clinically. My office will repeat triglyceride level in 4 weeks.  If she has recurrent pancreatitis, should consider elective lap chole for possibility of microlithiasis.  ______________________________ Rachael Fee, MD  n. eSIGNED:   Rachael Fee at 07/06/2011 02:56 PM  Lillette Boxer, [phone]

## 2011-07-06 NOTE — Telephone Encounter (Signed)
Pt also to get repeat triglycerides in 4 weeks

## 2011-07-06 NOTE — Telephone Encounter (Signed)
Dr. Gerrit Friends on 07/26/11 @ 2:15pm, arrive @ 1:45pm.

## 2011-07-06 NOTE — Progress Notes (Signed)
TRANSFERRED TO RECOVERY AT 1503.

## 2011-07-07 ENCOUNTER — Telehealth: Payer: Self-pay | Admitting: *Deleted

## 2011-07-07 ENCOUNTER — Ambulatory Visit: Payer: BC Managed Care – PPO

## 2011-07-07 DIAGNOSIS — K859 Acute pancreatitis without necrosis or infection, unspecified: Secondary | ICD-10-CM

## 2011-07-07 NOTE — Telephone Encounter (Signed)
Pt has been notified and will have labs done this week, and again in 4 weeks

## 2011-07-07 NOTE — Telephone Encounter (Signed)
NO ANSWER, MESSAGE LEFT FOR THE PATIENT. 

## 2011-07-07 NOTE — Telephone Encounter (Signed)
Left message on machine to call back  

## 2011-07-08 LAB — CELIAC PANEL 10
Gliadin IgA: 3.6 U/mL (ref <20)
Gliadin IgG: 4.5 U/mL (ref <20)
IgA: 185 mg/dL (ref 69–380)
Tissue Transglut Ab: 5.5 U/mL (ref <20)

## 2011-07-26 ENCOUNTER — Ambulatory Visit (INDEPENDENT_AMBULATORY_CARE_PROVIDER_SITE_OTHER): Payer: BC Managed Care – PPO | Admitting: Surgery

## 2011-07-26 ENCOUNTER — Encounter (INDEPENDENT_AMBULATORY_CARE_PROVIDER_SITE_OTHER): Payer: Self-pay | Admitting: Surgery

## 2011-07-26 VITALS — BP 118/76 | HR 64 | Temp 98.0°F | Resp 12 | Ht 63.0 in | Wt 162.4 lb

## 2011-07-26 DIAGNOSIS — R1011 Right upper quadrant pain: Secondary | ICD-10-CM

## 2011-07-26 DIAGNOSIS — K859 Acute pancreatitis without necrosis or infection, unspecified: Secondary | ICD-10-CM

## 2011-07-26 NOTE — Progress Notes (Signed)
Chief Complaint  Patient presents with  . Abdominal Pain    referral by Dr. Wendall Papa, [facility]    HISTORY: Patient is a 41 year old white female referred by her gastroenterologist for evaluation of abdominal pain, recent episode of pancreatitis, and weight loss. The patient has not been feeling well for approximately 6 months. She has experienced approximately a 25 pound weight loss over that interval. She describes intermittent episodes of this epigastric abdominal pain with nausea and vomiting. She has only been able to tolerate a bland diet. She notes significant gas and bloating.  Patient has had an extensive workup after her visit to the emergency department in early January. At that time she underwent a CT scan of the abdomen and pelvis which showed findings consistent with mild acute pancreatitis. She did have an elevated triglyceride level. Abdominal ultrasound was performed in February 2013 and was normal without any evidence of gallbladder or bile duct pathology. Patient had an upper endoscopy performed by her gastroenterologist and no significant findings were noted.  The patient denies any prior history of hepatobiliary or pancreatic disease. Of note multiple family members have undergone cholecystectomy including her father, her mother, and her sister.   Past Medical History  Diagnosis Date  . Irritable bowel   . Indigestion   . Chronic diarrhea   . Anxiety   . Depression   . GERD (gastroesophageal reflux disease)   . Arthritis   . Pancreatitis   . Hyperlipidemia      Current Outpatient Prescriptions  Medication Sig Dispense Refill  . gemfibrozil (LOPID) 600 MG tablet       . omeprazole (PRILOSEC OTC) 20 MG tablet Take 20 mg by mouth daily.         Allergies  Allergen Reactions  . Bee Venom Swelling     Family History  Problem Relation Age of Onset  . Prostate cancer Father   . Cirrhosis Father     alcohol use  . Crohn's disease Father   . Cancer Father      prostate  . Breast cancer Paternal Aunt   . Cancer Paternal Aunt     breast  . Crohn's disease Paternal Aunt   . Diabetes Maternal Grandmother   . Diabetes Paternal Grandmother   . Diabetes Cousin   . Diabetes Paternal Aunt   . Diabetes Maternal Uncle   . Cancer Maternal Uncle     colon  . Heart disease Maternal Grandfather   . Heart disease Maternal Uncle   . Heart disease Paternal Uncle   . Irritable bowel syndrome Paternal Aunt   . Irritable bowel syndrome Paternal Aunt   . Irritable bowel syndrome Paternal Aunt   . Irritable bowel syndrome Sister   . Prostate cancer Maternal Uncle   . Pancreatic cancer Maternal Uncle   . Cancer Paternal Grandfather     prostate     History   Social History  . Marital Status: Single    Spouse Name: N/A    Number of Children: 0  . Years of Education: N/A   Occupational History  . survey tech    Social History Main Topics  . Smoking status: Current Everyday Smoker -- 0.7 packs/day    Types: Cigarettes  . Smokeless tobacco: Never Used   Comment: tobacco handout given 2/20//2013  . Alcohol Use: No  . Drug Use: No  . Sexually Active: None   Other Topics Concern  . None   Social History Narrative  . None  REVIEW OF SYSTEMS - PERTINENT POSITIVES ONLY: Patient notes nausea following most meals. She does tolerate a bland diet very and she has had significant weight loss as noted above. Patient has experienced one episode of jaundice although laboratory studies were normal. No acholic stools. No fever. No chills.  EXAM: Filed Vitals:   07/26/11 1426  BP: 118/76  Pulse: 64  Temp: 98 F (36.7 C)  Resp: 12    HEENT: normocephalic; pupils equal and reactive; sclerae clear; dentition good; mucous membranes moist NECK:  symmetric on extension; no palpable anterior or posterior cervical lymphadenopathy; no supraclavicular masses; no tenderness CHEST: clear to auscultation bilaterally without rales, rhonchi, or  wheezes CARDIAC: regular rate and rhythm without significant murmur; peripheral pulses are full ABDOMEN: soft without distension; bowel sounds present; no mass; no hepatosplenomegaly; no hernia EXT:  non-tender without edema; no deformity NEURO: no gross focal deficits; no sign of tremor   LABORATORY RESULTS: See [facility] (CHL-Epic) for most recent results   RADIOLOGY RESULTS: See [facility] (CHL-Epic) for most recent results   IMPRESSION: #1 epigastric abdominal pain of undetermined etiology #2 history of elevated triglyceride level #3 history of mild acute pancreatitis documented by CT scan  PLAN: I had a lengthy discussion with the patient and her mother. We reviewed all the above records and studies. They are very interested in considering cholecystectomy for treatment of the symptoms that the patient has been experiencing over the past 6 months given her family history.  I have recommended that we proceed with a nuclear medicine hepatobiliary scan to evaluate the function of the gallbladder. I have recommended using CCK injection at the time of the study.  Certainly this would give [place] better insight into the functional aspects of her illness instead of just the anatomic findings which are all completely normal.  I discussed at length with the patient and her mother the possibility of cholecystectomy. I explained to them that in the face of cholelithiasis, approximately 95% of patients experience symptoms relief with cholecystectomy. In the face of an abnormal hepatobiliary scan, approximately 60-70% of patients experience symptomatic relief. However with all studies being normal, even with the clinical scenario that this patient presents, I would say her likelihood of symptom improvement with cholecystectomy would be approximately 50% or less.  The patient and her mother understand the above issues. They agreed to proceed with hepatobiliary scanning. I will contact her  with those results and make further recommendations for management at that time.  Velora Heckler, MD, FACS General & Endocrine Surgery [facility]   Visit Diagnoses: 1. Pancreatitis   2. Abdominal pain, right upper quadrant     Primary Care Physician: Ruthe Mannan, MD, MD  GI:  Dr. Wendall Papa

## 2011-07-26 NOTE — Patient Instructions (Signed)
Gallbladder Disease  Gallbladder disease (cholecystitis) is an inflammation of your gallbladder. It is usually caused by a build-up of stones (gallstones) or sludge (cholelithiasis) in your gallbladder. The gallbladder is not an essential organ. It is located slightly to the right of center in the belly (abdomen), behind the liver. It stores bile made in the liver. Bile aids in digestion of fats. Gallbladder disease may result in nausea (feeling sick to your stomach), abdominal pain, and jaundice. In severe cases, emergency surgery may be required.  The most common type of gallbladder disease is gallstones. They begin as small crystals and slowly grow into stones. Gallstone pain occurs when the bile duct has spasms. The spasms are caused by the stone passing out of the duct. The stone is trying to pass at the same time bile is passing into the small bowel for digestion. The pain usually begins suddenly. It may persist from several minutes to several hours. Infection can occur. Infection can add to discomfort and severity of an acute attack. The pain may be made worse by breathing deeply or by being jarred. There may be fever and tenderness to the touch. In some cases, when gallstones do not move into the bile duct, people have no pain or symptoms. These are called "silent" gallstones.  Women are three times more likely to develop gallstones than men. Women who have had several pregnancies are more likely to have gallbladder disease. Physicians sometimes advise removing diseased gallbladders before future pregnancies. Other factors that increase the risk of gallbladder disease are obesity, diets heavy in fried foods and dairy products, increasing age, prolonged use of medications containing female hormones, and heredity.  HOME CARE INSTRUCTIONS    If your physician prescribed an antibiotic, take as directed.   Only take over-the-counter or prescription medicines for pain, discomfort, or fever as directed by your  caregiver.   Follow a low fat diet until seen again. (Fat causes the gallbladder to contract.)   Follow-up as instructed. Attacks are almost always recurrent and surgery is usually required for permanent treatment.  SEEK IMMEDIATE MEDICAL CARE IF:    Pain is increasing and not controlled by medications.   The pain moves to another part of your abdomen or to your back. (Right sided pain can be appendicitis and left sided pain in adults can be diverticulitis).   You have a fever.   You develop nausea and vomiting.  Document Released: 04/26/2005 Document Revised: 04/15/2011 Document Reviewed: 03/12/2011  ExitCare Patient Information 2012 [employer].

## 2011-08-03 ENCOUNTER — Encounter (HOSPITAL_COMMUNITY)
Admission: RE | Admit: 2011-08-03 | Discharge: 2011-08-03 | Disposition: A | Discharge status: Home / Self Care | Payer: BC Managed Care – PPO | Source: Ambulatory Visit | Attending: Surgery | Admitting: Surgery

## 2011-08-03 ENCOUNTER — Encounter (HOSPITAL_COMMUNITY): Payer: Self-pay

## 2011-08-03 DIAGNOSIS — R1011 Right upper quadrant pain: Secondary | ICD-10-CM | POA: Insufficient documentation

## 2011-08-03 DIAGNOSIS — K859 Acute pancreatitis without necrosis or infection, unspecified: Secondary | ICD-10-CM

## 2011-08-03 MED ORDER — TECHNETIUM TC 99M MEBROFENIN IV KIT
5.5000 | PACK | Freq: Once | INTRAVENOUS | Status: AC | PRN
  Administered 2011-08-03: 5.5 via INTRAVENOUS

## 2011-08-03 MED ORDER — SINCALIDE 5 MCG IJ SOLR: 0.0200 ug/kg | Freq: Once | INTRAMUSCULAR | Status: DC

## 2011-08-11 ENCOUNTER — Telehealth (INDEPENDENT_AMBULATORY_CARE_PROVIDER_SITE_OTHER): Payer: Self-pay | Admitting: Surgery

## 2011-08-11 ENCOUNTER — Other Ambulatory Visit (INDEPENDENT_AMBULATORY_CARE_PROVIDER_SITE_OTHER): Payer: Self-pay | Admitting: Surgery

## 2011-08-11 ENCOUNTER — Telehealth (INDEPENDENT_AMBULATORY_CARE_PROVIDER_SITE_OTHER): Payer: Self-pay

## 2011-08-11 DIAGNOSIS — R1011 Right upper quadrant pain: Secondary | ICD-10-CM

## 2011-08-11 DIAGNOSIS — K828 Other specified diseases of gallbladder: Secondary | ICD-10-CM | POA: Insufficient documentation

## 2011-08-11 NOTE — Telephone Encounter (Signed)
The mother called and stated the pt had a test done last week and she would like to know the results and what to do next.  Please call the patient who is at home today [phone].

## 2011-08-11 NOTE — Telephone Encounter (Signed)
Called results of scan to patient.  Low ejection fraction at 27.5%.  Patient was symptomatic with administration of CCK.  Will proceed with scheduling of surgery at patient request.  The risks and benefits of the procedure have been discussed at length with the patient.  The patient understands the proposed procedure, potential alternative treatments, and the course of recovery to be expected.  All of the patient's questions have been answered at this time.  The patient wishes to proceed with surgery.  Velora Heckler, MD, [facility] Office: [phone]

## 2011-08-11 NOTE — Telephone Encounter (Signed)
Copy to Dr. Gerrit Friends who has not been here for review.

## 2011-08-20 ENCOUNTER — Encounter (HOSPITAL_COMMUNITY): Payer: Self-pay | Admitting: Pharmacy Technician

## 2011-08-25 ENCOUNTER — Encounter (HOSPITAL_COMMUNITY): Payer: Self-pay

## 2011-08-25 ENCOUNTER — Encounter (HOSPITAL_COMMUNITY)
Admission: RE | Admit: 2011-08-25 | Discharge: 2011-08-25 | Disposition: A | Discharge status: Home / Self Care | Payer: BC Managed Care – PPO | Source: Ambulatory Visit | Attending: Surgery | Admitting: Surgery

## 2011-08-25 HISTORY — DX: Unspecified hearing loss, unspecified ear: H91.90

## 2011-08-25 LAB — COMPREHENSIVE METABOLIC PANEL
ALT: 12 U/L (ref 0–35)
AST: 15 U/L (ref 0–37)
Albumin: 4 g/dL (ref 3.5–5.2)
Alkaline Phosphatase: 61 U/L (ref 39–117)
Calcium: 9.5 mg/dL (ref 8.4–10.5)
Potassium: 3.9 mEq/L (ref 3.5–5.1)
Sodium: 138 mEq/L (ref 135–145)
Total Protein: 7.4 g/dL (ref 6.0–8.3)

## 2011-08-25 LAB — CBC
HCT: 41.6 % (ref 36.0–46.0)
Hemoglobin: 14.5 g/dL (ref 12.0–15.0)
MCH: 31 pg (ref 26.0–34.0)
MCHC: 34.9 g/dL (ref 30.0–36.0)
MCV: 88.9 fL (ref 78.0–100.0)
RBC: 4.68 MIL/uL (ref 3.87–5.11)

## 2011-08-25 LAB — SURGICAL PCR SCREEN: MRSA, PCR: NEGATIVE

## 2011-08-25 NOTE — Progress Notes (Signed)
Quick Note:  These results are acceptable for scheduled surgery. TMG ______ 

## 2011-08-25 NOTE — Patient Instructions (Addendum)
Sherri Dalton  08/25/2011   Your procedure is scheduled on:  Thursday 09-02-2011  Report to [facility] at  0730 AM.  Call this number if you have problems the morning of surgery: [phone]   Remember:   Do not eat food or drink liquids:After Midnight.  .  Take these medicines the morning of surgery with A SIP OF WATER: omeprazole   Do not wear jewelry or make up.  Do not wear lotions, powders, or perfumes.Do not wear deodorant.    Do not bring valuables to the hospital.  Contacts, dentures or bridgework may not be worn into surgery.  Leave suitcase in the car. After surgery it may be brought to your room.  For patients admitted to the hospital, checkout time is 11:00 AM the day of discharge.     Special Instructions: CHG Shower Use Special Wash: 1/2 bottle night before surgery and 1/2 bottle morning of surgery, neck down use regular soap on your face and front and back private areas. Do not shave for 2 days before showers.   Please read over the following fact sheets that you were given: MRSA Information  [name] pre op nurse phone number [phone], call if needed

## 2011-08-26 NOTE — Progress Notes (Signed)
Faxed to [facility] 

## 2011-08-26 NOTE — Pre-Procedure Instructions (Signed)
FAX RECEIVED AND PLACED ON CHART DR Gerrit Friends REVIEWED ALL LABS ALL RESULTS ACCEPTABLE FOR SURGERY

## 2011-09-02 ENCOUNTER — Encounter (HOSPITAL_COMMUNITY): Payer: Self-pay | Admitting: Anesthesiology

## 2011-09-02 ENCOUNTER — Ambulatory Visit (HOSPITAL_COMMUNITY): Payer: BC Managed Care – PPO | Admitting: Anesthesiology

## 2011-09-02 ENCOUNTER — Observation Stay (HOSPITAL_COMMUNITY)
Admission: RE | Admit: 2011-09-02 | Discharge: 2011-09-03 | Disposition: A | Discharge status: Home / Self Care | Payer: BC Managed Care – PPO | Source: Ambulatory Visit | Attending: Surgery | Admitting: Surgery

## 2011-09-02 ENCOUNTER — Ambulatory Visit (HOSPITAL_COMMUNITY): Payer: BC Managed Care – PPO

## 2011-09-02 ENCOUNTER — Encounter (HOSPITAL_COMMUNITY)
Admission: RE | Disposition: A | Discharge status: Home / Self Care | Payer: Self-pay | Source: Ambulatory Visit | Attending: Surgery

## 2011-09-02 ENCOUNTER — Encounter (HOSPITAL_COMMUNITY): Payer: Self-pay | Admitting: *Deleted

## 2011-09-02 DIAGNOSIS — R5381 Other malaise: Secondary | ICD-10-CM | POA: Insufficient documentation

## 2011-09-02 DIAGNOSIS — R5383 Other fatigue: Secondary | ICD-10-CM | POA: Insufficient documentation

## 2011-09-02 DIAGNOSIS — K811 Chronic cholecystitis: Principal | ICD-10-CM | POA: Insufficient documentation

## 2011-09-02 DIAGNOSIS — R1011 Right upper quadrant pain: Secondary | ICD-10-CM | POA: Diagnosis present

## 2011-09-02 DIAGNOSIS — Z79899 Other long term (current) drug therapy: Secondary | ICD-10-CM | POA: Insufficient documentation

## 2011-09-02 DIAGNOSIS — K219 Gastro-esophageal reflux disease without esophagitis: Secondary | ICD-10-CM | POA: Insufficient documentation

## 2011-09-02 DIAGNOSIS — E785 Hyperlipidemia, unspecified: Secondary | ICD-10-CM | POA: Insufficient documentation

## 2011-09-02 DIAGNOSIS — Z01812 Encounter for preprocedural laboratory examination: Secondary | ICD-10-CM | POA: Insufficient documentation

## 2011-09-02 DIAGNOSIS — K828 Other specified diseases of gallbladder: Secondary | ICD-10-CM | POA: Insufficient documentation

## 2011-09-02 DIAGNOSIS — K589 Irritable bowel syndrome without diarrhea: Secondary | ICD-10-CM | POA: Insufficient documentation

## 2011-09-02 DIAGNOSIS — R634 Abnormal weight loss: Secondary | ICD-10-CM | POA: Insufficient documentation

## 2011-09-02 HISTORY — PX: CHOLECYSTECTOMY: SHX55

## 2011-09-02 SURGERY — LAPAROSCOPIC CHOLECYSTECTOMY WITH INTRAOPERATIVE CHOLANGIOGRAM
Anesthesia: General | Site: Abdomen | Wound class: Contaminated

## 2011-09-02 MED ORDER — CEFAZOLIN SODIUM 1-5 GM-% IV SOLN
INTRAVENOUS | Status: AC
  Filled 2011-09-02: qty 50

## 2011-09-02 MED ORDER — ONDANSETRON HCL 4 MG PO TABS
4.0000 mg | ORAL_TABLET | Freq: Four times a day (QID) | ORAL | Status: DC | PRN
  Filled 2011-09-02: qty 1

## 2011-09-02 MED ORDER — MEPERIDINE HCL 50 MG/ML IJ SOLN: 6.2500 mg | INTRAMUSCULAR | Status: DC | PRN

## 2011-09-02 MED ORDER — IOHEXOL 300 MG/ML  SOLN
INTRAMUSCULAR | Status: DC | PRN
  Administered 2011-09-02: 50 mL via INTRAVENOUS

## 2011-09-02 MED ORDER — LACTATED RINGERS IV SOLN: INTRAVENOUS | Status: DC

## 2011-09-02 MED ORDER — PROPOFOL 10 MG/ML IV BOLUS
INTRAVENOUS | Status: DC | PRN
  Administered 2011-09-02: 150 mg via INTRAVENOUS

## 2011-09-02 MED ORDER — CEFAZOLIN SODIUM 1-5 GM-% IV SOLN
1.0000 g | INTRAVENOUS | Status: AC
  Administered 2011-09-02: 1 g via INTRAVENOUS

## 2011-09-02 MED ORDER — BUPIVACAINE-EPINEPHRINE 0.5% -1:200000 IJ SOLN
INTRAMUSCULAR | Status: DC | PRN
  Administered 2011-09-02: 20 mL

## 2011-09-02 MED ORDER — ONDANSETRON HCL 4 MG/2ML IJ SOLN
INTRAMUSCULAR | Status: DC | PRN
  Administered 2011-09-02: 4 mg via INTRAVENOUS

## 2011-09-02 MED ORDER — HYDROMORPHONE HCL PF 1 MG/ML IJ SOLN
INTRAMUSCULAR | Status: DC | PRN
  Administered 2011-09-02 (×2): 1 mg via INTRAVENOUS

## 2011-09-02 MED ORDER — KCL IN DEXTROSE-NACL 20-5-0.45 MEQ/L-%-% IV SOLN
INTRAVENOUS | Status: DC
  Administered 2011-09-02 – 2011-09-03 (×2): via INTRAVENOUS
  Filled 2011-09-02 (×2): qty 1000

## 2011-09-02 MED ORDER — ONDANSETRON HCL 4 MG/2ML IJ SOLN: 4.0000 mg | Freq: Four times a day (QID) | INTRAMUSCULAR | Status: DC | PRN

## 2011-09-02 MED ORDER — ROCURONIUM BROMIDE 100 MG/10ML IV SOLN
INTRAVENOUS | Status: DC | PRN
  Administered 2011-09-02: 40 mg via INTRAVENOUS

## 2011-09-02 MED ORDER — HYDROMORPHONE HCL PF 1 MG/ML IJ SOLN: 1.0000 mg | INTRAMUSCULAR | Status: DC | PRN

## 2011-09-02 MED ORDER — ACETAMINOPHEN 10 MG/ML IV SOLN
INTRAVENOUS | Status: AC
  Filled 2011-09-02: qty 100

## 2011-09-02 MED ORDER — GLYCOPYRROLATE 0.2 MG/ML IJ SOLN
INTRAMUSCULAR | Status: DC | PRN
  Administered 2011-09-02: 0.2 mg via INTRAVENOUS

## 2011-09-02 MED ORDER — ACETAMINOPHEN 325 MG PO TABS
650.0000 mg | ORAL_TABLET | ORAL | Status: DC | PRN
  Administered 2011-09-02: 650 mg via ORAL
  Filled 2011-09-02: qty 2

## 2011-09-02 MED ORDER — ACETAMINOPHEN 10 MG/ML IV SOLN
INTRAVENOUS | Status: DC | PRN
  Administered 2011-09-02: 1000 mg via INTRAVENOUS

## 2011-09-02 MED ORDER — BUPIVACAINE-EPINEPHRINE (PF) 0.5% -1:200000 IJ SOLN
INTRAMUSCULAR | Status: AC
  Filled 2011-09-02: qty 10

## 2011-09-02 MED ORDER — FENTANYL CITRATE 0.05 MG/ML IJ SOLN
INTRAMUSCULAR | Status: DC | PRN
  Administered 2011-09-02: 100 ug via INTRAVENOUS
  Administered 2011-09-02 (×3): 50 ug via INTRAVENOUS
  Administered 2011-09-02: 100 ug via INTRAVENOUS

## 2011-09-02 MED ORDER — 0.9 % SODIUM CHLORIDE (POUR BTL) OPTIME
TOPICAL | Status: DC | PRN
  Administered 2011-09-02: 1000 mL

## 2011-09-02 MED ORDER — PROMETHAZINE HCL 25 MG/ML IJ SOLN: 6.2500 mg | INTRAMUSCULAR | Status: DC | PRN

## 2011-09-02 MED ORDER — IOHEXOL 300 MG/ML  SOLN
INTRAMUSCULAR | Status: AC
  Filled 2011-09-02: qty 1

## 2011-09-02 MED ORDER — LACTATED RINGERS IR SOLN
Status: DC | PRN
  Administered 2011-09-02: 1000 mL

## 2011-09-02 MED ORDER — HYDROCODONE-ACETAMINOPHEN 5-325 MG PO TABS
1.0000 | ORAL_TABLET | ORAL | Status: DC | PRN
  Administered 2011-09-02 (×2): 2 via ORAL
  Administered 2011-09-03: 1 via ORAL
  Administered 2011-09-03: 2 via ORAL
  Filled 2011-09-02 (×4): qty 2

## 2011-09-02 MED ORDER — HYDROMORPHONE HCL PF 1 MG/ML IJ SOLN: 0.2500 mg | INTRAMUSCULAR | Status: DC | PRN

## 2011-09-02 MED ORDER — LACTATED RINGERS IV SOLN
INTRAVENOUS | Status: DC
  Administered 2011-09-02: 11:00:00 via INTRAVENOUS
  Administered 2011-09-02: 1000 mL via INTRAVENOUS

## 2011-09-02 MED ORDER — MIDAZOLAM HCL 5 MG/5ML IJ SOLN
INTRAMUSCULAR | Status: DC | PRN
  Administered 2011-09-02: 2 mg via INTRAVENOUS

## 2011-09-02 SURGICAL SUPPLY — 40 items
APPLIER CLIP ROT 10 11.4 M/L (STAPLE) ×2
BENZOIN TINCTURE PRP APPL 2/3 (GAUZE/BANDAGES/DRESSINGS) ×2 IMPLANT
CABLE HIGH FREQUENCY MONO STRZ (ELECTRODE) ×2 IMPLANT
CANISTER SUCTION 2500CC (MISCELLANEOUS) ×2 IMPLANT
CHLORAPREP W/TINT 26ML (MISCELLANEOUS) ×2 IMPLANT
CLIP APPLIE ROT 10 11.4 M/L (STAPLE) ×1 IMPLANT
CLOTH BEACON ORANGE TIMEOUT ST (SAFETY) ×2 IMPLANT
CLSR STERI-STRIP ANTIMIC 1/2X4 (GAUZE/BANDAGES/DRESSINGS) ×2 IMPLANT
COVER MAYO STAND STRL (DRAPES) ×2 IMPLANT
DECANTER SPIKE VIAL GLASS SM (MISCELLANEOUS) ×2 IMPLANT
DRAPE C-ARM 42X72 X-RAY (DRAPES) ×2 IMPLANT
DRAPE LAPAROSCOPIC ABDOMINAL (DRAPES) ×2 IMPLANT
DRAPE UTILITY 15X26 (DRAPE) ×2 IMPLANT
ELECT REM PT RETURN 9FT ADLT (ELECTROSURGICAL) ×2
ELECTRODE REM PT RTRN 9FT ADLT (ELECTROSURGICAL) ×1 IMPLANT
GAUZE SPONGE 2X2 8PLY STRL LF (GAUZE/BANDAGES/DRESSINGS) ×1 IMPLANT
GLOVE BIOGEL PI IND STRL 7.0 (GLOVE) ×1 IMPLANT
GLOVE BIOGEL PI INDICATOR 7.0 (GLOVE) ×1
GLOVE SURG ORTHO 8.0 STRL STRW (GLOVE) ×2 IMPLANT
GOWN STRL NON-REIN LRG LVL3 (GOWN DISPOSABLE) ×2 IMPLANT
GOWN STRL REIN XL XLG (GOWN DISPOSABLE) ×4 IMPLANT
HEMOSTAT SURGICEL 4X8 (HEMOSTASIS) IMPLANT
KIT BASIN OR (CUSTOM PROCEDURE TRAY) ×2 IMPLANT
NS IRRIG 1000ML POUR BTL (IV SOLUTION) ×2 IMPLANT
POUCH SPECIMEN RETRIEVAL 10MM (ENDOMECHANICALS) ×2 IMPLANT
SCISSORS LAP 5X35 DISP (ENDOMECHANICALS) IMPLANT
SET CHOLANGIOGRAPH MIX (MISCELLANEOUS) ×2 IMPLANT
SET IRRIG TUBING LAPAROSCOPIC (IRRIGATION / IRRIGATOR) ×2 IMPLANT
SLEEVE Z-THREAD 5X100MM (TROCAR) ×2 IMPLANT
SOLUTION ANTI FOG 6CC (MISCELLANEOUS) ×2 IMPLANT
SPONGE GAUZE 2X2 STER 10/PKG (GAUZE/BANDAGES/DRESSINGS) ×1
STRIP CLOSURE SKIN 1/2X4 (GAUZE/BANDAGES/DRESSINGS) ×2 IMPLANT
SUT MNCRL AB 4-0 PS2 18 (SUTURE) ×2 IMPLANT
TAPE PAPER 3X10 WHT MICROPORE (GAUZE/BANDAGES/DRESSINGS) ×2 IMPLANT
TOWEL OR 17X26 10 PK STRL BLUE (TOWEL DISPOSABLE) ×6 IMPLANT
TRAY LAP CHOLE (CUSTOM PROCEDURE TRAY) ×2 IMPLANT
TROCAR XCEL BLUNT TIP 100MML (ENDOMECHANICALS) ×2 IMPLANT
TROCAR Z-THREAD FIOS 11X100 BL (TROCAR) ×2 IMPLANT
TROCAR Z-THREAD FIOS 5X100MM (TROCAR) ×4 IMPLANT
TUBING INSUFFLATION 10FT LAP (TUBING) ×2 IMPLANT

## 2011-09-02 NOTE — Anesthesia Postprocedure Evaluation (Signed)
  Anesthesia Post-op Note  Patient: Sherri Dalton  Procedure(s) Performed: Procedure(s) (LRB): LAPAROSCOPIC CHOLECYSTECTOMY WITH INTRAOPERATIVE CHOLANGIOGRAM (N/A)  Patient Location: PACU  Anesthesia Type: General  Level of Consciousness: awake and alert   Airway and Oxygen Therapy: Patient Spontanous Breathing  Post-op Pain: mild  Post-op Assessment: Post-op Vital signs reviewed, Patient's Cardiovascular Status Stable, Respiratory Function Stable, Patent Airway and No signs of Nausea or vomiting  Post-op Vital Signs: stable  Complications: No apparent anesthesia complications

## 2011-09-02 NOTE — H&P (Signed)
Sherri Dalton is an 41 y.o. female.    Chief Complaint: abdominal pain, biliary dyskinesia  HPI: Patient is a 41 year old white female referred by her gastroenterologist for evaluation of abdominal pain, recent episode of pancreatitis, and weight loss. The patient has not been feeling well for approximately 6 months. She has experienced approximately a 25 pound weight loss over that interval. She describes intermittent episodes of this epigastric abdominal pain with nausea and vomiting. She has only been able to tolerate a bland diet. She notes significant gas and bloating.  Patient has had an extensive workup after her visit to the emergency department in early January. At that time she underwent a CT scan of the abdomen and pelvis which showed findings consistent with mild acute pancreatitis. She did have an elevated triglyceride level. Abdominal ultrasound was performed in February 2013 and was normal without any evidence of gallbladder or bile duct pathology. Patient had an upper endoscopy performed by her gastroenterologist and no significant findings were noted.  The patient denies any prior history of hepatobiliary or pancreatic disease. Of note multiple family members have undergone cholecystectomy including her father, her mother, and her sister. Subsequent nuclear medicine hepatobiliary scan shows an abnormal ejection fraction consistent with biliary dyskinesia.  Past Medical History  Diagnosis Date  . Irritable bowel   . Indigestion   . Chronic diarrhea   . Anxiety   . Depression   . GERD (gastroesophageal reflux disease)   . Arthritis   . Pancreatitis   . Hyperlipidemia   . Hard of hearing     left ear    Past Surgical History  Procedure Date  . Anterior cruciate ligament repair yrs ago    left leg    Family History  Problem Relation Age of Onset  . Prostate cancer Father   . Cirrhosis Father     alcohol use  . Crohn's disease Father   . Cancer Father     prostate    . Breast cancer Paternal Aunt   . Cancer Paternal Aunt     breast  . Crohn's disease Paternal Aunt   . Diabetes Maternal Grandmother   . Diabetes Paternal Grandmother   . Diabetes Cousin   . Diabetes Paternal Aunt   . Diabetes Maternal Uncle   . Cancer Maternal Uncle     colon  . Heart disease Maternal Grandfather   . Heart disease Maternal Uncle   . Heart disease Paternal Uncle   . Irritable bowel syndrome Paternal Aunt   . Irritable bowel syndrome Paternal Aunt   . Irritable bowel syndrome Paternal Aunt   . Irritable bowel syndrome Sister   . Prostate cancer Maternal Uncle   . Pancreatic cancer Maternal Uncle   . Cancer Paternal Grandfather     prostate   Social History:  reports that she has been smoking Cigarettes.  She has a 7.5 pack-year smoking history. She has never used smokeless tobacco. She reports that she does not drink alcohol or use illicit drugs.  Allergies:  Allergies  Allergen Reactions  . Adhesive (Tape) Other (See Comments)    redness  . Bee Venom Swelling    Medications Prior to Admission  Medication Sig Dispense Refill  . gemfibrozil (LOPID) 600 MG tablet Take 600 mg by mouth 2 (two) times daily.       Marland Kitchen omeprazole (PRILOSEC OTC) 20 MG tablet Take 20 mg by mouth every morning.       Marland Kitchen guaifenesin (ROBITUSSIN) 100 MG/5ML syrup Take  400 mg by mouth 3 (three) times daily as needed. Cough       . naproxen sodium (ANAPROX) 220 MG tablet Take 220 mg by mouth 2 (two) times daily as needed. Pain       . valACYclovir (VALTREX) 500 MG tablet Take 500 mg by mouth as needed.        No results found for this or any previous visit (from the past 48 hour(s)). No results found.  Review of Systems  Constitutional: Positive for weight loss and malaise/fatigue. Negative for fever, chills and diaphoresis.  HENT: Negative.  Negative for neck pain.   Eyes: Negative.   Respiratory: Negative.   Cardiovascular: Negative.   Gastrointestinal: Positive for nausea,  vomiting and abdominal pain (right upper quadrant). Negative for heartburn, diarrhea, constipation, blood in stool and melena.  Genitourinary: Negative.   Musculoskeletal: Positive for joint pain. Negative for myalgias, back pain and falls.  Skin: Negative.   Neurological: Negative.  Negative for weakness.  Endo/Heme/Allergies: Negative.   Psychiatric/Behavioral: Negative.     Blood pressure 107/66, pulse 51, temperature 97.2 F (36.2 C), temperature source Oral, resp. rate 18, last menstrual period 08/31/2011, SpO2 98.00%. Physical Exam  Constitutional: She is oriented to person, place, and time. She appears well-developed and well-nourished. No distress.  HENT:  Head: Normocephalic and atraumatic.  Right Ear: External ear normal.  Left Ear: External ear normal.  Nose: Nose normal.  Mouth/Throat: Oropharynx is clear and moist.  Eyes: Conjunctivae and EOM are normal. Pupils are equal, round, and reactive to light.  Neck: Normal range of motion. Neck supple. No tracheal deviation present. No thyromegaly present.  Cardiovascular: Normal rate, normal heart sounds and intact distal pulses.  Exam reveals no gallop.   No murmur heard. Respiratory: Effort normal and breath sounds normal. She has no wheezes. She has no rales.  GI: Soft. Bowel sounds are normal. She exhibits no distension and no mass. There is tenderness (right upper quadrant and epigastrium). There is no rebound and no guarding.  Musculoskeletal: Normal range of motion. She exhibits no edema and no tenderness.  Lymphadenopathy:    She has no cervical adenopathy.  Neurological: She is alert and oriented to person, place, and time. She has normal reflexes.  Skin: Skin is warm and dry.  Psychiatric: She has a normal mood and affect. Her behavior is normal. Judgment and thought content normal.     Assessment/Plan 1. Abdominal pain, biliary dyskinesia 2. Hx of pancreatitis 3. Hypertriglyceridemia 4. GERD  Plan laparoscopic  cholecystectomy with IOC for biliary dyskinesia and history of pancreatitis.  The risks and benefits of the procedure have been discussed at length with the patient.  The patient understands the proposed procedure, potential alternative treatments, and the course of recovery to be expected.  All of the patient's questions have been answered at this time.  The patient wishes to proceed with surgery.   Velora Heckler, MD, [facility] Office: [phone]    Sherri Dalton 09/02/2011, 9:52 AM

## 2011-09-02 NOTE — Op Note (Signed)
Laparoscopic Cholecystectomy with IOC Procedure Note  Pre-operative Diagnosis: Other specified disorder of gallbladder, biliary dyskinesia, abdominal pain  Post-operative Diagnosis: Same  Surgeon:  Velora Heckler, MD, FACS  Assistant:  None    Anesthesia:  General  Indications: This patient presents with symptomatic gallbladder disease and will undergo laparoscopic cholecystectomy.  Procedure Details  The patient was seen again in the Holding Room. The risks, benefits, complications, treatment options, and expected outcomes were discussed with the patient. The patient and/or family concurred with the proposed plan, giving informed consent.  The patient was taken to Operating Room, identified as Sherri Dalton and the procedure verified as Laparoscopic Cholecystectomy with Intraoperative Cholangiograms. A Time Out was held and the above information confirmed.  Prior to the induction of general anesthesia, antibiotic prophylaxis was administered. General endotracheal anesthesia was then administered and tolerated well. After the induction, the abdomen was prepped in the usual aseptic fashion. The patient was positioned in the supine position with some reverse Trendelenburg.  An incision was made in the skin near the umbilicus. The midline fascia was incised and the Hasson canula was introduced under direct vision. It was secured with a pursestring 0-Vicryl suture placed in the usual fashion. Pneumoperitoneum was then created with CO2 and tolerated well without any adverse changes in the patient's vital signs. Additional trocars were introduced under direct vision along the right costal margin in the midline, mid-clavicular line, and mid-axillary line.  The gallbladder was identified and the fundus grasped and retracted cephalad. Adhesions were lysed bluntly and with the electrocautery where needed, taking care not to injure any adjacent structures. The infundibulum was grasped and retracted  laterally, exposing the peritoneum overlying the triangle of Calot. This was then divided and exposed in a blunt fashion. The cystic duct was clearly identified and bluntly dissected circumferentially.  An incision was made in the cystic duct and the cholangiogram catheter introduced. The catheter was secured using an ligaclip.  Real-time cholangiography was performed using the C-arm.  There was rapid filling of a normal caliber common bile duct.  There was reflux of contrast into the left and right hepatic ductal systems.  There was free flow distally into the duodenum without filling defect or obstruction.  Catheter was removed from the peritoneal cavity.  The cystic duct was then triply ligated with surgical clips on the patient side and singly clipped on the gallbladder side and divided. The cystic artery was identified, dissected free, ligated with clips and divided as well.   The gallbladder was dissected from the liver bed with the electrocautery used for hemostasis. The gallbladder was completely removed and placed into an endocatch bag. The right upper quadrant was irrigated and inspected. Hemostasis was achieved with the electrocautery. Copious irrigation was utilized and was repeatedly aspirated until clear.  Pneumoperitoneum was released after viewing removal of the trocars with good hemostasis. The umbilical wound was irrigated and the fascia was then closed with the pursestring suture; the skin was then closed with 4-0 Monocril subcuticular sutures and a sterile dressing was applied.  Instrument, sponge, and needle counts were correct at closure and at the conclusion of the case.  Findings: Cholecystitis without Cholelithiasis  Estimated Blood Loss: Minimal         Drains: none         Specimens: Gallbladder to pathology       Complications: None; patient tolerated the procedure well.         Disposition: PACU - hemodynamically stable.  Condition: stable  Velora Heckler, MD, [facility] Office: [phone]

## 2011-09-02 NOTE — Anesthesia Preprocedure Evaluation (Signed)
Anesthesia Evaluation  Patient identified by MRN, date of birth, ID band Patient awake    Reviewed: Allergy & Precautions, H&P , NPO status , Patient's Chart, lab work & pertinent test results  Airway Mallampati: I TM Distance: >3 FB Neck ROM: Full    Dental No notable dental hx.    Pulmonary neg pulmonary ROS,  breath sounds clear to auscultation  Pulmonary exam normal       Cardiovascular negative cardio ROS  Rhythm:Regular Rate:Normal     Neuro/Psych negative neurological ROS  negative psych ROS   GI/Hepatic negative GI ROS, Neg liver ROS,   Endo/Other  negative endocrine ROS  Renal/GU negative Renal ROS  negative genitourinary   Musculoskeletal negative musculoskeletal ROS (+)   Abdominal   Peds negative pediatric ROS (+)  Hematology negative hematology ROS (+)   Anesthesia Other Findings   Reproductive/Obstetrics negative OB ROS                           Anesthesia Physical Anesthesia Plan  ASA: II  Anesthesia Plan: General   Post-op Pain Management:    Induction: Intravenous  Airway Management Planned: Oral ETT  Additional Equipment:   Intra-op Plan:   Post-operative Plan: Extubation in OR  Informed Consent: I have reviewed the patients History and Physical, chart, labs and discussed the procedure including the risks, benefits and alternatives for the proposed anesthesia with the patient or authorized representative who has indicated his/her understanding and acceptance.   Dental advisory given  Plan Discussed with: CRNA  Anesthesia Plan Comments:         Anesthesia Quick Evaluation  

## 2011-09-02 NOTE — Transfer of Care (Signed)
Immediate Anesthesia Transfer of Care Note  Patient: Sherri Dalton  Procedure(s) Performed: Procedure(s) (LRB): LAPAROSCOPIC CHOLECYSTECTOMY WITH INTRAOPERATIVE CHOLANGIOGRAM (N/A)  Patient Location: PACU  Anesthesia Type: General  Level of Consciousness: sedated, patient cooperative and responds to stimulaton  Airway & Oxygen Therapy: Patient Spontanous Breathing and Patient connected to face mask oxgen  Post-op Assessment: Report given to PACU RN and Post -op Vital signs reviewed and stable  Post vital signs: Reviewed and stable  Complications: No apparent anesthesia complications

## 2011-09-03 MED ORDER — HYDROCODONE-ACETAMINOPHEN 10-325 MG PO TABS: 1.0000 | ORAL_TABLET | ORAL | Status: AC | PRN

## 2011-09-03 NOTE — Discharge Summary (Signed)
Physician Discharge Summary [facility]  Patient ID: Sherri Dalton MRN: [id number] DOB/AGE: June 23, 1970 41 y.o.  Admit date: 09/02/2011 Discharge date: 09/03/2011  Admission Diagnoses:  Biliary dyskinesia, abdominal pain  Discharge Diagnoses:  Principal Problem:  *Biliary dyskinesia Active Problems:  Abdominal pain, right upper quadrant   Discharged Condition: good  Hospital Course: patient admitted for observation after lap chole.  Stable overnight.  Tolerated diet.  Pain controlled.  Prepared for discharge home POD#1.  Consults: None  Significant Diagnostic Studies: none   Treatments: surgery: lap chole with IOC  Discharge Exam: Blood pressure 122/71, pulse 56, temperature 98.3 F (36.8 C), temperature source Oral, resp. rate 16, height 5\' 4"  (1.626 m), weight 156 lb (70.761 kg), last menstrual period 08/31/2011, SpO2 98.00%. HEENT - clear Neck - soft Chest - clear bilat Cor - RRR Abd - soft without distension; BS present; dressing dry and intact Ext - no edema  Disposition: Home with family  Discharge Orders    Future Orders Please Complete By Expires   Diet - low sodium heart healthy      Increase activity slowly      Discharge instructions      Comments:   [facility] LAPAROSCOPIC SURGERY: POST OP INSTRUCTIONS  Always review your discharge instruction sheet given to you by the facility where your surgery was performed.  A prescription for pain medication may be given to you upon discharge.  Take your pain medication as prescribed, if needed.  If narcotic pain medicine is not needed, then you may take acetaminophen (Tylenol) or ibuprofen (Advil) as needed. Take your usually prescribed medications unless otherwise directed. If you need a refill on your pain medication, please contact your pharmacy.  They will contact our office to request authorization. Prescriptions will not be filled after 5pm or on week-ends. You  should follow a light diet the first few days after arrival home, such as soup and crackers, etc.  Be sure to include lots of fluids daily. Most patients will experience some swelling and bruising in the area of the incisions.  Ice packs will help.  Swelling and bruising can take several days to resolve.  It is common to experience some constipation if taking pain medication after surgery.  Increasing fluid intake and taking a stool softener (such as Colace) will usually help or prevent this problem from occurring.  A mild laxative (Milk of Magnesia or Miralax) should be taken according to package instructions if there are no bowel movements after 48 hours. Unless discharge instructions indicate otherwise, you may remove your bandages 24-48 hours after surgery, and you may shower at that time.  You may have steri-strips (small skin tapes) in place directly over the incision.  These strips should be left on the skin for 7-10 days.  If your surgeon used skin glue on the incision, you may shower in 24 hours.  The glue will flake off over the next 2-3 weeks.  Any sutures or staples will be removed at the office during your follow-up visit. ACTIVITIES:  You may resume regular (light) daily activities beginning the next day--such as daily self-care, walking, climbing stairs--gradually increasing activities as tolerated.  You may have sexual intercourse when it is comfortable.  Refrain from any heavy lifting or straining until approved by your doctor. You may drive when you are no longer taking prescription pain medication, you can comfortably wear a seatbelt, and you can safely maneuver your car and apply brakes. You should  see your doctor in the office for a follow-up appointment approximately 2-3 weeks after your surgery.  Make sure that you call for this appointment within a day or two after you arrive home to insure a convenient appointment time.  WHEN TO CALL YOUR DOCTOR: Fever over 101.0 Inability to  urinate Continued bleeding from incision. Increased pain, redness, or drainage from the incision. Increasing abdominal pain  The clinic staff is available to answer your questions during regular business hours.  Please don't hesitate to call and ask to speak to one of the nurses for clinical concerns.  If you have a medical emergency, go to the nearest emergency room or call 911.  A surgeon from [facility] is always on call at the hospital. [phone] ? [phone] ? FAX [phone] Web site: [email or web address]   Remove dressing in 24 hours        Medication List  As of 09/03/2011 10:32 AM   TAKE these medications         gemfibrozil 600 MG tablet   Commonly known as: LOPID   Take 600 mg by mouth 2 (two) times daily.      guaifenesin 100 MG/5ML syrup   Commonly known as: ROBITUSSIN   Take 400 mg by mouth 3 (three) times daily as needed. Cough        HYDROcodone-acetaminophen 10-325 MG per tablet   Commonly known as: NORCO   Take 1-2 tablets by mouth every 4 (four) hours as needed for pain.      naproxen sodium 220 MG tablet   Commonly known as: ANAPROX   Take 220 mg by mouth 2 (two) times daily as needed. Pain        omeprazole 20 MG tablet   Commonly known as: PRILOSEC OTC   Take 20 mg by mouth every morning.      valACYclovir 500 MG tablet   Commonly known as: VALTREX   Take 500 mg by mouth as needed.           Velora Heckler, MD, [facility] Office: [phone]    Signed: Velora Heckler 09/03/2011, 10:32 AM

## 2011-09-03 NOTE — Progress Notes (Signed)
Patient and her mother given discharge instructions and they verbalized understanding.  Patient stable for discharge home.  Patient understands when to follow up with MD and when she would need to call MD for any problems.  Patient given prescription for pain medication.  Allayne Butcher [facility]  09/03/2011  11:08 AM

## 2011-09-07 ENCOUNTER — Telehealth (INDEPENDENT_AMBULATORY_CARE_PROVIDER_SITE_OTHER): Payer: Self-pay

## 2011-09-07 NOTE — Telephone Encounter (Signed)
LMOM that we will call back with PO appt date.

## 2011-09-08 ENCOUNTER — Encounter (HOSPITAL_COMMUNITY): Payer: Self-pay | Admitting: Surgery

## 2011-09-09 ENCOUNTER — Telehealth (INDEPENDENT_AMBULATORY_CARE_PROVIDER_SITE_OTHER): Payer: Self-pay

## 2011-09-09 NOTE — Telephone Encounter (Signed)
LMOM with date of appt. 

## 2011-09-30 ENCOUNTER — Encounter (INDEPENDENT_AMBULATORY_CARE_PROVIDER_SITE_OTHER): Payer: Self-pay | Admitting: Surgery

## 2011-09-30 ENCOUNTER — Ambulatory Visit (INDEPENDENT_AMBULATORY_CARE_PROVIDER_SITE_OTHER): Payer: BC Managed Care – PPO | Admitting: Surgery

## 2011-09-30 VITALS — BP 104/92 | HR 64 | Temp 98.2°F | Resp 14 | Ht 64.0 in | Wt 156.8 lb

## 2011-09-30 DIAGNOSIS — R1011 Right upper quadrant pain: Secondary | ICD-10-CM

## 2011-09-30 DIAGNOSIS — K859 Acute pancreatitis without necrosis or infection, unspecified: Secondary | ICD-10-CM

## 2011-09-30 DIAGNOSIS — K828 Other specified diseases of gallbladder: Secondary | ICD-10-CM

## 2011-09-30 NOTE — Progress Notes (Signed)
Visit Diagnoses: 1. Abdominal pain, chronic, right upper quadrant   2. Biliary dyskinesia   3. Pancreatitis     HISTORY: Patient returns for her first postoperative visit having undergone laparoscopic cholecystectomy for biliary dyskinesia and history of pancreatitis. Postoperatively the patient has done well. She has had no further pain. She is tolerating a regular diet. She is having normal formed bowel movements.  EXAM: Abdomen is soft nontender without distention. Wounds are well healed. No sign of herniation. No sign of infection. Right upper quadrant is soft and nontender without palpable mass.  IMPRESSION: Status post laparoscopic cholecystectomy for biliary dyskinesia with symptomatic improvement.  PLAN: Patient will apply topical creams her incisions. She is released to full activity. She will return to see me as needed.  Velora Heckler, MD, FACS General & Endocrine Surgery [facility]

## 2011-09-30 NOTE — Patient Instructions (Signed)
  COCOA BUTTER & VITAMIN E CREAM  ([employer] or other brand)  Apply cocoa butter/vitamin E cream to your incision 2 - 3 times daily.  Massage cream into incision for one minute with each application.  Use sunscreen (50 SPF or higher) for first 6 months after surgery if area is exposed to sun.  You may substitute Mederma or other scar reducing creams as desired.   

## 2011-11-03 ENCOUNTER — Telehealth: Payer: Self-pay

## 2011-11-03 NOTE — Telephone Encounter (Signed)
Pt has had labs.

## 2011-11-03 NOTE — Telephone Encounter (Signed)
Message copied by Donata Duff on Wed Nov 03, 2011  8:06 AM ------      Message from: Donata Duff      Created: Tue Jul 06, 2011  3:55 PM       Pt to get labs

## 2012-07-10 IMAGING — US US ABDOMEN COMPLETE
1 series · 14 of 25 positions shown · non-contrast
Comparison: CT scan 05/29/2011.

CLINICAL DATA: Abdominal pain and nausea.

COMPLETE ABDOMINAL ULTRASOUND

[Series 1: us abdomen complete · 0.33mm/px · 14 of 93 slices shown]
[im 1/93]
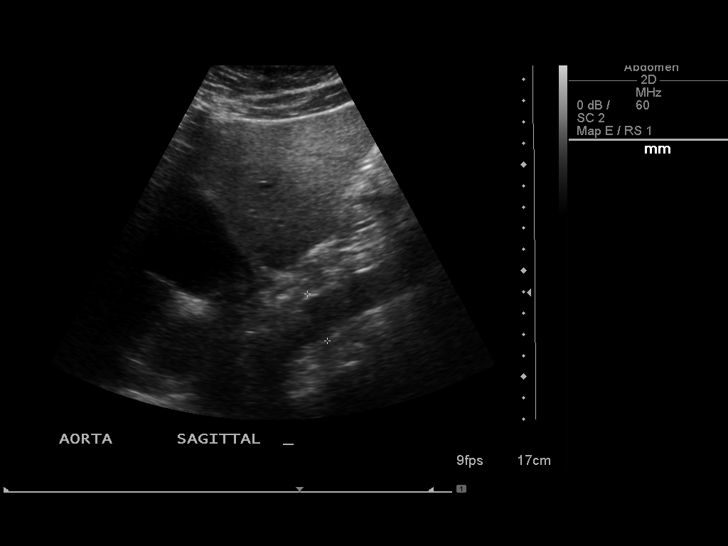
[im 8/93]
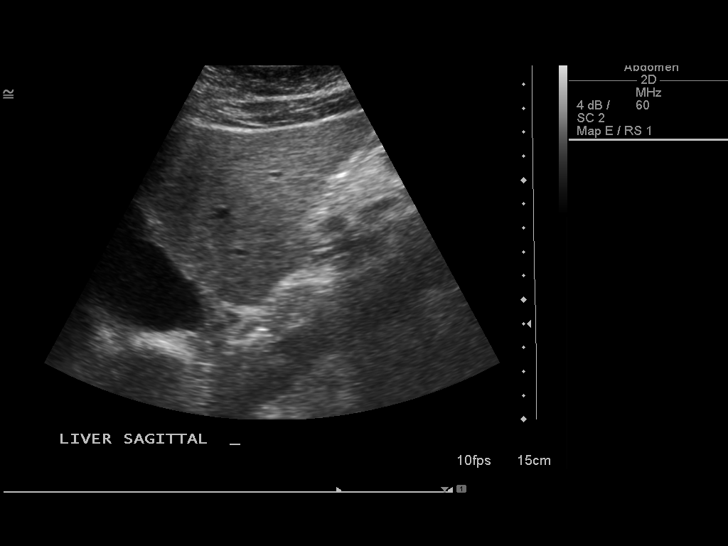
[im 16/93]
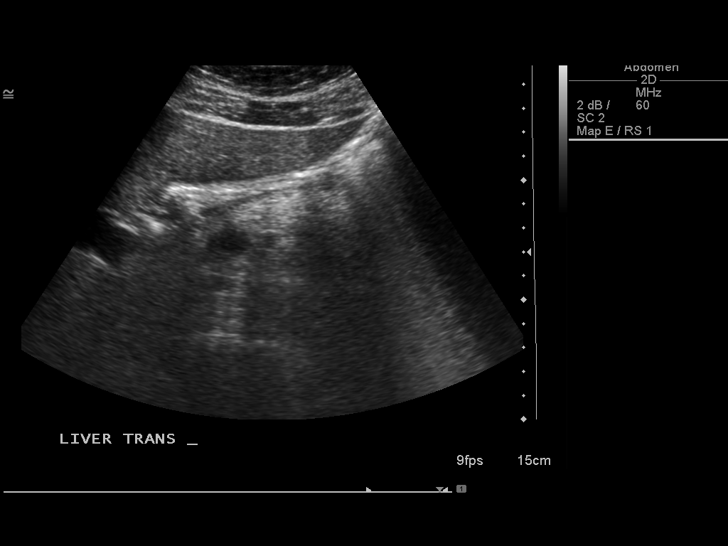
[im 24/93]
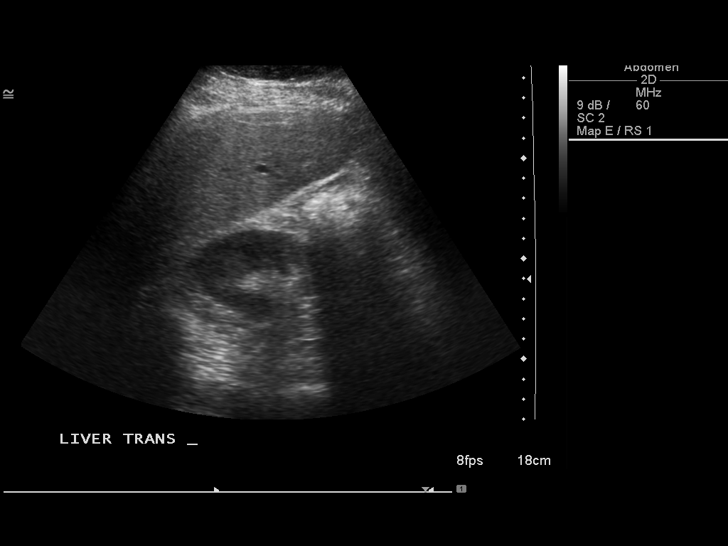
[im 31/93]
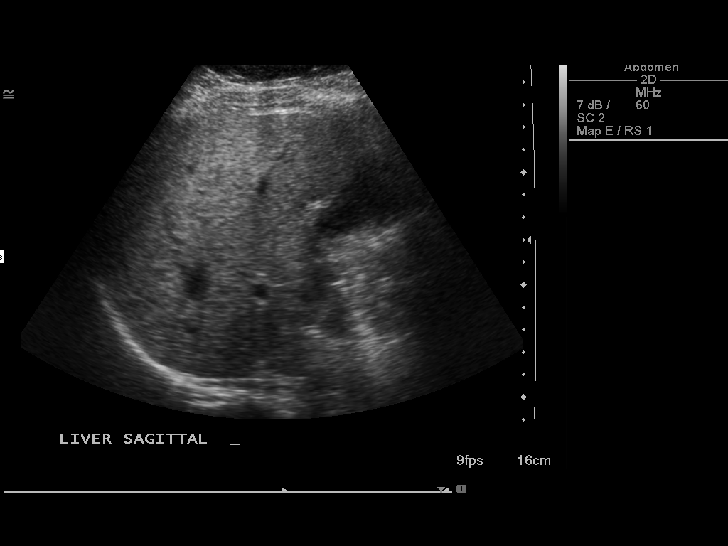
[im 35/93]
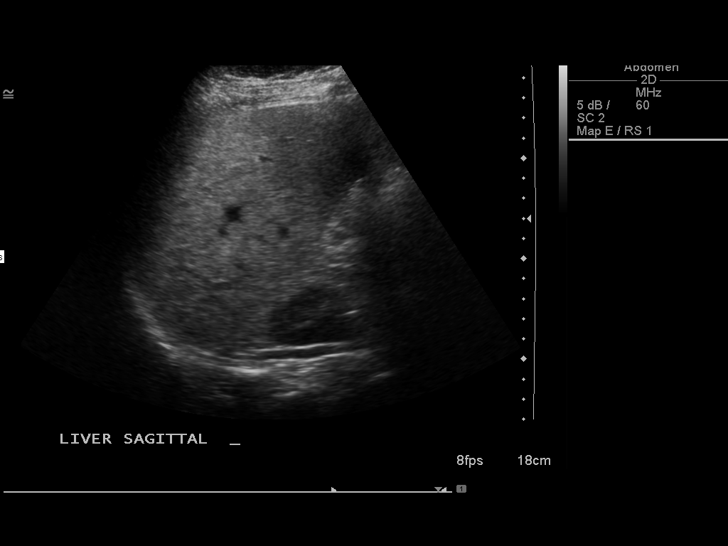
[im 43/93]
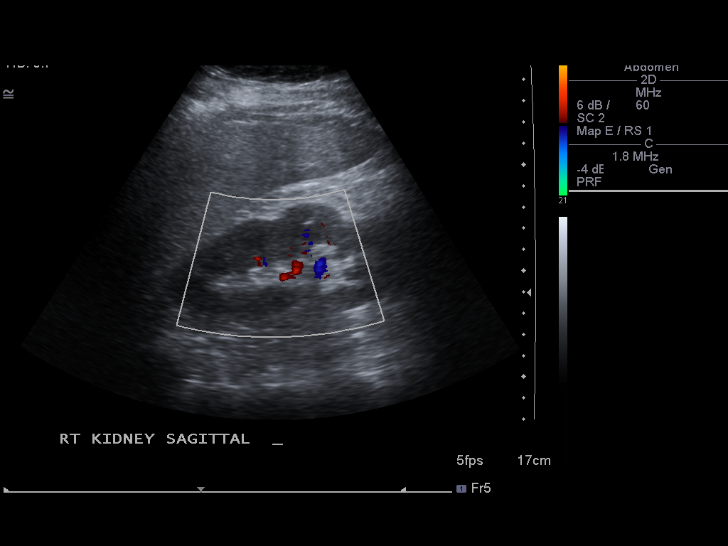
[im 50/93]
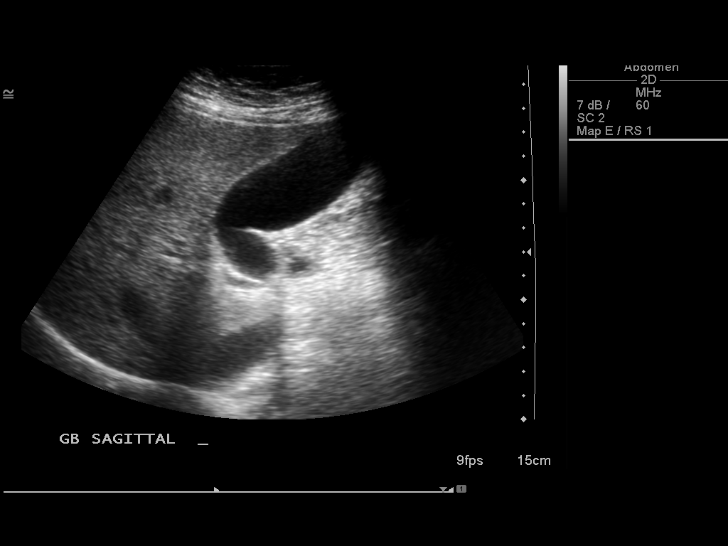
[im 58/93]
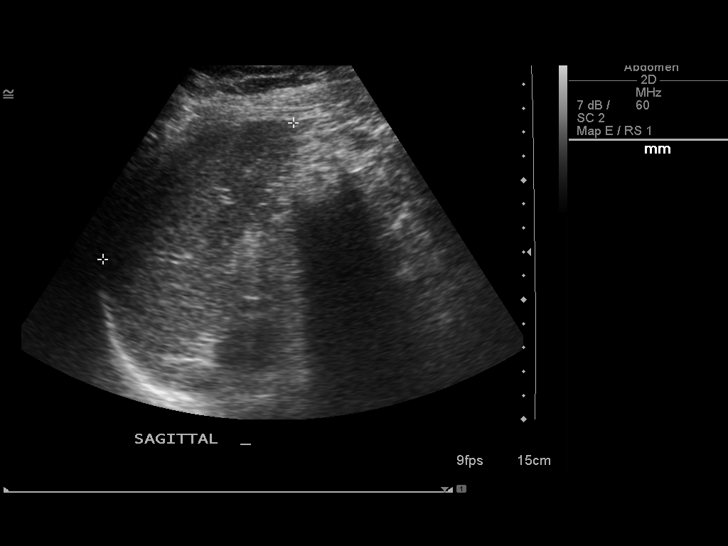
[im 62/93]
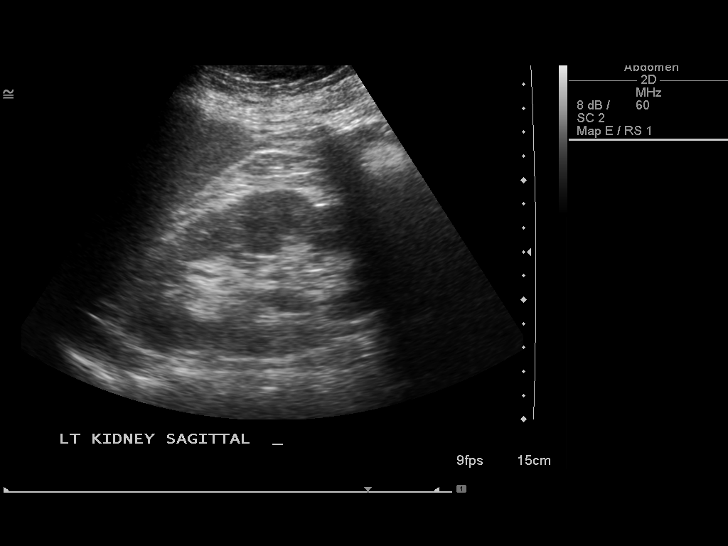
[im 70/93]
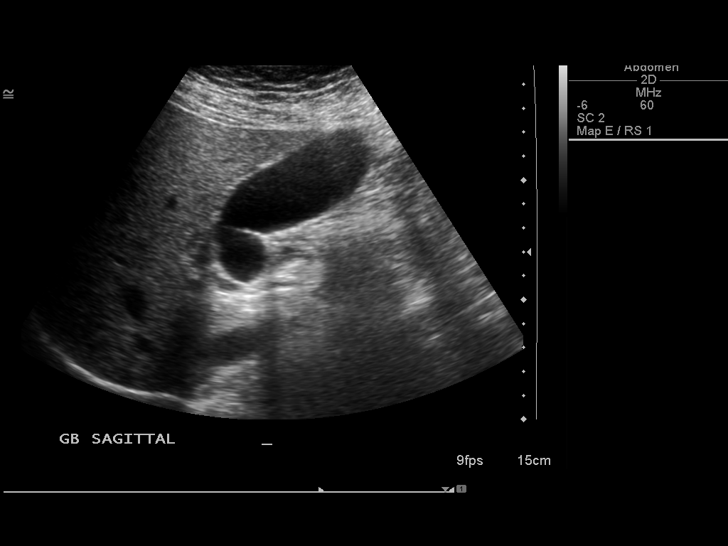
[im 77/93]
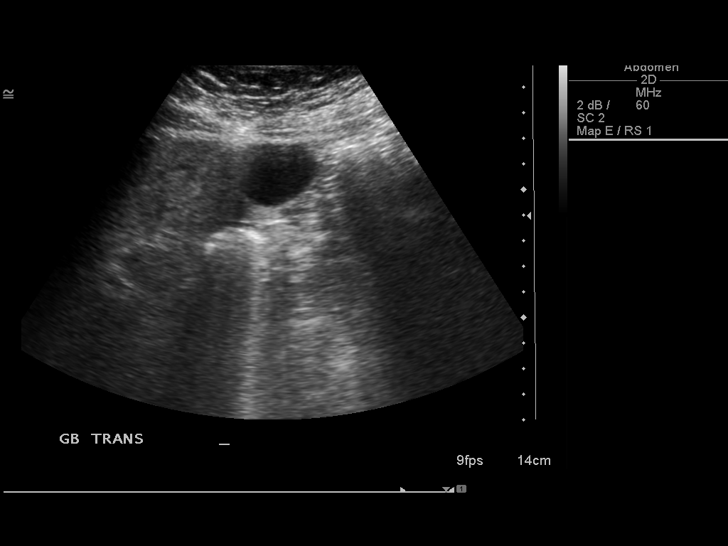
[im 85/93]
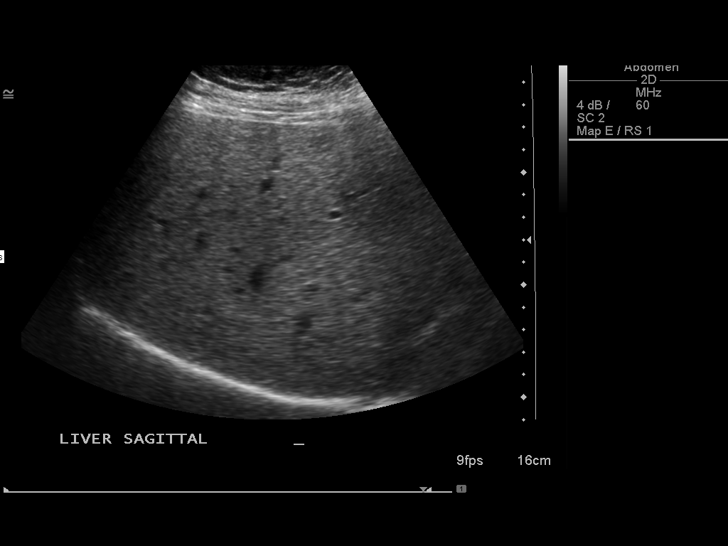
[im 93/93]
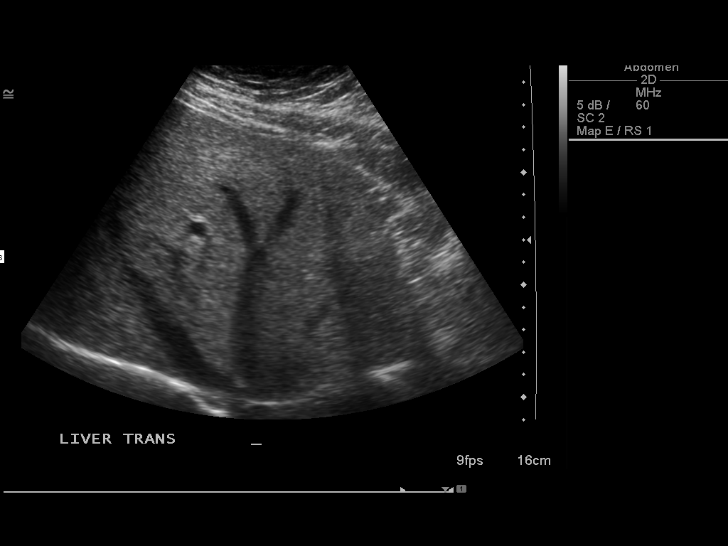

[14 of 25 positions shown; findings below may reference images not displayed]

FINDINGS: Gallbladder:  No gallstones, gallbladder wall thickening, or
pericholecystic fluid.

Common bile duct:  Normal in caliber measuring a maximum of 1.9mm.

Liver:  There is diffuse increased echogenicity of the liver and
decreased through transmission consistent with fatty infiltration.
No focal lesions or biliary dilatation.

IVC:  Normal caliber.

Pancreas:  Sonographically normal.

Spleen:  Normal size and echogenicity without focal lesions.

Right Kidney:  12.5 cm in length. Normal renal cortical thickness
and echogenicity without focal lesions or hydronephrosis.

Left Kidney:  12.3 cm in length. Normal renal cortical thickness
and echogenicity without focal lesions or hydronephrosis.

Abdominal aorta:  Normal caliber.
IMPRESSION: 1.  Normal sonographic appearance of the gallbladder and normal
caliber common bile duct.
2.  Mild diffuse fatty infiltration of the liver.
3.  The pancreas, spleen and both kidneys are unremarkable.

## 2012-08-12 IMAGING — NM NM HEPATO W/GB/PHARM/[PERSON_NAME]
1 series · 12 of 12 positions shown · non-contrast
Comparison: none

CLINICAL DATA: Abdominal pain for 3 months.  Negative ultrasound.

[Series 1: hepato · 4.46mm/px · 2 acquisitions, 12 frames shown]
[im 1/2]
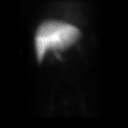
[im 1/2]
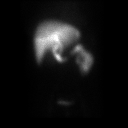
[im 1/2]
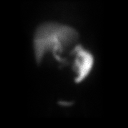
[im 1/2]
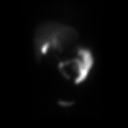
[im 1/2]
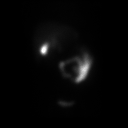
[im 1/2]
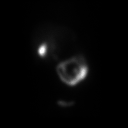
[im 2/2]
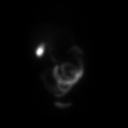
[im 2/2]
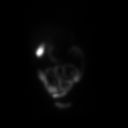
[im 2/2]
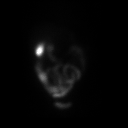
[im 2/2]
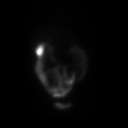
[im 2/2]
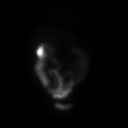
[im 2/2]
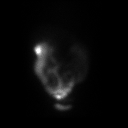

[12 of 12 positions shown; findings below may reference images not displayed]

HEPATOBILIARY SCINTIGRAPHY WITH EJECTION FRACTION

Technique and findings:  Anterior imaging afterI.Im1i Gc44G
Choletec IV. There is prompt clearance of the radiopharmaceutical
from the blood pool. Timely visualization of activity in central
bile ducts, small bowel, and gallbladder.
After 1 hour,1.4 mcg CCK was infused intravenously and imaging
continued. The patient described sharp pain with infusion. The
calculated gallbladder ejection fraction over 30 minutes is 27.5%
[(normal >30% at  thirty minutes (Ziessman et al., 7336 J. Nuclear
Med).]

IMPRESSION
1. Patency of cystic and common bile ducts.
2. Low gallbladder ejection fraction.

## 2012-11-04 ENCOUNTER — Emergency Department: Payer: Self-pay | Admitting: Internal Medicine

## 2013-09-03 ENCOUNTER — Emergency Department (HOSPITAL_COMMUNITY)
Admission: EM | Admit: 2013-09-03 | Discharge: 2013-09-03 | Disposition: A | Discharge status: Home / Self Care | Payer: BC Managed Care – PPO | Source: Home / Self Care | Attending: Emergency Medicine | Admitting: Emergency Medicine

## 2013-09-03 ENCOUNTER — Encounter (HOSPITAL_COMMUNITY): Payer: Self-pay | Admitting: Emergency Medicine

## 2013-09-03 DIAGNOSIS — T148 Other injury of unspecified body region: Secondary | ICD-10-CM

## 2013-09-03 DIAGNOSIS — W57XXXA Bitten or stung by nonvenomous insect and other nonvenomous arthropods, initial encounter: Secondary | ICD-10-CM

## 2013-09-03 MED ORDER — TRIAMCINOLONE ACETONIDE 0.1 % EX CREA: 1.0000 "application " | TOPICAL_CREAM | Freq: Three times a day (TID) | CUTANEOUS | Status: DC

## 2013-09-03 MED ORDER — DOXYCYCLINE HYCLATE 100 MG PO TABS: 100.0000 mg | ORAL_TABLET | Freq: Two times a day (BID) | ORAL | Status: DC

## 2013-09-03 NOTE — ED Notes (Signed)
C/o 2 weeks ago , she had a bite that cleared w/o treatment, and area has developed swelling, redness again since then. NAD. No other areas , no one else in home affected

## 2013-09-03 NOTE — ED Provider Notes (Signed)
  Chief Complaint    Chief Complaint  Patient presents with  . Skin Problem    History of Present Illness      Sherri Dalton is a 43 year old female had a two-week history of rash on her right, lower, lateral leg just above the ankle. This started out as a small red bump, draining some pus, then it seemed to blister up, then spread. The lesion itches and hurts minimally. She denies any other skin rash. She's had no systemic symptoms such as fever, chills, headache, or myalgias. No history of a tick bite or insect bite. She is not exposed to wood areas.  Review of Systems   Other than as noted above, the patient denies any of the following symptoms: Systemic:  No fever or chills. ENT:  No nasal congestion, rhinorrhea, sore throat, swelling of lips, tongue or throat. Resp:  No cough, wheezing, or shortness of breath.  [place]    Past medical history, family history, social history, meds, and allergies were reviewed.   Physical Exam     Vital signs:  BP 116/73  Pulse 54  Temp(Src) 98.7 F (37.1 C) (Oral)  Resp 14  SpO2 100% Gen:  Alert, oriented, in no distress. ENT:  Pharynx clear, no intraoral lesions, moist mucous membranes. Lungs:  Clear to auscultation. Skin:  There was an erythematous, triangular shaped area measuring 2.5 x 3 cm on the right lower leg, just above the ankle. There was a scaly papule on the lips. There are several small erythematous papules at the periphery. There is some purplish discoloration at the center.    Assessment    The encounter diagnosis was Insect bite.  Differential diagnosis is tick bite with infection, insect bite, or allergic dermatitis.  Plan     1.  Meds:  The following meds were prescribed:   New Prescriptions   DOXYCYCLINE (VIBRA-TABS) 100 MG TABLET    Take 1 tablet (100 mg total) by mouth 2 (two) times daily.   TRIAMCINOLONE CREAM (KENALOG) 0.1 %    Apply 1 application topically 3 (three) times daily.    2.  Patient  Education/Counseling:  The patient was given appropriate handouts, self care instructions, and instructed in symptomatic relief.    3.  Follow up:  The patient was told to follow up here if no better in 7-10 days, or sooner if becoming worse in any way, and given some red flag symptoms such as worsening rash, fever, or difficulty breathing which would prompt immediate return.  Follow up here if necessary.      Harden Mo, MD 09/03/13 [phone]

## 2013-09-03 NOTE — Discharge Instructions (Signed)
Insect Bite  Mosquitoes, flies, fleas, bedbugs, and many other insects can bite. Insect bites are different from insect stings. A sting is when venom is injected into the skin. Some insect bites can transmit infectious diseases.  SYMPTOMS   Insect bites usually turn red, swell, and itch for 2 to 4 days. They often go away on their own.  TREATMENT   Your caregiver may prescribe antibiotic medicines if a bacterial infection develops in the bite.  HOME CARE INSTRUCTIONS   Do not scratch the bite area.   Keep the bite area clean and dry. Wash the bite area thoroughly with soap and water.   Put ice or cool compresses on the bite area.   Put ice in a plastic bag.   Place a towel between your skin and the bag.   Leave the ice on for 20 minutes, 4 times a day for the first 2 to 3 days, or as directed.   You may apply a baking soda paste, cortisone cream, or calamine lotion to the bite area as directed by your caregiver. This can help reduce itching and swelling.   Only take over-the-counter or prescription medicines as directed by your caregiver.   If you are given antibiotics, take them as directed. Finish them even if you start to feel better.  You may need a tetanus shot if:   You cannot remember when you had your last tetanus shot.   You have never had a tetanus shot.   The injury broke your skin.  If you get a tetanus shot, your arm may swell, get red, and feel warm to the touch. This is common and not a problem. If you need a tetanus shot and you choose not to have one, there is a rare chance of getting tetanus. Sickness from tetanus can be serious.  SEEK IMMEDIATE MEDICAL CARE IF:    You have increased pain, redness, or swelling in the bite area.   You see a red line on the skin coming from the bite.   You have a fever.   You have joint pain.   You have a headache or neck pain.   You have unusual weakness.   You have a rash.   You have chest pain or shortness of breath.    You have abdominal pain, nausea, or vomiting.   You feel unusually tired or sleepy.  MAKE SURE YOU:    Understand these instructions.   Will watch your condition.   Will get help right away if you are not doing well or get worse.  Document Released: 06/03/2004 Document Revised: 07/19/2011 Document Reviewed: 11/25/2010  [employer] Patient Information 2014 [employer].

## 2016-03-04 ENCOUNTER — Ambulatory Visit: Payer: BLUE CROSS/BLUE SHIELD | Admitting: Family Medicine

## 2016-03-04 ENCOUNTER — Encounter: Payer: Self-pay | Admitting: Family Medicine

## 2016-03-04 NOTE — Progress Notes (Unsigned)
Pre visit review using our clinic review tool, if applicable. No additional management support is needed unless otherwise documented below in the visit note. 

## 2016-08-16 ENCOUNTER — Encounter: Payer: Self-pay | Admitting: Family Medicine

## 2016-08-16 ENCOUNTER — Encounter (INDEPENDENT_AMBULATORY_CARE_PROVIDER_SITE_OTHER): Payer: Self-pay

## 2016-08-16 ENCOUNTER — Ambulatory Visit (INDEPENDENT_AMBULATORY_CARE_PROVIDER_SITE_OTHER): Payer: BLUE CROSS/BLUE SHIELD | Admitting: Family Medicine

## 2016-08-16 VITALS — BP 104/70 | HR 71 | Wt 166.5 lb

## 2016-08-16 DIAGNOSIS — K219 Gastro-esophageal reflux disease without esophagitis: Secondary | ICD-10-CM | POA: Diagnosis not present

## 2016-08-16 DIAGNOSIS — R5383 Other fatigue: Secondary | ICD-10-CM | POA: Diagnosis not present

## 2016-08-16 DIAGNOSIS — Z720 Tobacco use: Secondary | ICD-10-CM

## 2016-08-16 LAB — COMPREHENSIVE METABOLIC PANEL
ALBUMIN: 4.3 g/dL (ref 3.5–5.2)
ALK PHOS: 51 U/L (ref 39–117)
ALT: 19 U/L (ref 0–35)
AST: 15 U/L (ref 0–37)
BUN: 12 mg/dL (ref 6–23)
CHLORIDE: 102 meq/L (ref 96–112)
CO2: 28 mEq/L (ref 19–32)
Calcium: 10.1 mg/dL (ref 8.4–10.5)
Creatinine, Ser: 0.7 mg/dL (ref 0.40–1.20)
GFR: 96.35 mL/min (ref >60.00)
Glucose, Bld: 95 mg/dL (ref 70–99)
POTASSIUM: 4.1 meq/L (ref 3.5–5.1)
Sodium: 138 mEq/L (ref 135–145)
TOTAL PROTEIN: 7.3 g/dL (ref 6.0–8.3)
Total Bilirubin: 0.3 mg/dL (ref 0.2–1.2)

## 2016-08-16 LAB — H. PYLORI ANTIBODY, IGG: H PYLORI IGG: NEGATIVE

## 2016-08-16 LAB — TSH: TSH: 2.51 u[IU]/mL (ref 0.35–4.50)

## 2016-08-16 LAB — CBC
HCT: 43 % (ref 36.0–46.0)
HEMOGLOBIN: 15.2 g/dL — AB (ref 12.0–15.0)
MCHC: 35.3 g/dL (ref 30.0–36.0)
MCV: 89.5 fl (ref 78.0–100.0)
PLATELETS: 317 10*3/uL (ref 150.0–400.0)
RBC: 4.81 Mil/uL (ref 3.87–5.11)
RDW: 13.3 % (ref 11.5–15.5)
WBC: 9.5 10*3/uL (ref 4.0–10.5)

## 2016-08-16 LAB — LIPASE: LIPASE: 33 U/L (ref 11.0–59.0)

## 2016-08-16 LAB — VITAMIN B12: VITAMIN B 12: 413 pg/mL (ref 211–911)

## 2016-08-16 MED ORDER — BUPROPION HCL ER (SMOKING DET) 150 MG PO TB12: 150.0000 mg | ORAL_TABLET | Freq: Two times a day (BID) | ORAL | 3 refills | Status: DC

## 2016-08-16 NOTE — Progress Notes (Addendum)
Subjective:   Patient ID: Sherri Dalton, female    DOB: Oct 08, 1970, 46 y.o.   MRN: [id number]  Sherri Dalton is a pleasant 46 y.o. year old female who presents to clinic today with Acute Visit (?? ulcer --burning pan H/o diarrhea,constipation , bloating--joint pain ,tired all the time, scallop tongue)  on 08/16/2016  HPI:  I have not seen this patient in over 5 years.  She has been really tired for past 6 months.  Sleeps well.  Denies feeling depressed.  Has always had "IBS stools"- intermittent constipation and diarrhea but this is no different. No blood in stool.  She has noticed some sharp pains in stomach, bloating and joint pain- feels different than her pain when she had pancreatitis.  No black stools.  Does not take NSAIDs regularly.  Antacids help some but does not take anything on a regular basis.  Endoscopy done by Dr. Ardis Hughs in 06/2011 was normal- reviewed today.  She does have a remote h/o pancreatitis and biliary colic which led to lap cholecystectomy in 09/2011.  [facility] Results  Component Value Date   LIPASE 27.0 06/30/2011   [facility] Results  Component Value Date   ALT 12 08/25/2011   AST 15 08/25/2011   ALKPHOS 61 08/25/2011   BILITOT 0.4 08/25/2011    She is smoker- ready to quit. Smoking 1/2 ppd. Quit years ago with Zyban. Would like to try this again.  Current Outpatient Prescriptions on File Prior to Visit  Medication Sig Dispense Refill  . valACYclovir (VALTREX) 500 MG tablet Take 500 mg by mouth as needed.     No current facility-administered medications on file prior to visit.     Allergies  Allergen Reactions  . Adhesive [Tape] Other (See Comments)    redness  . Bee Venom Swelling    Past Medical History:  Diagnosis Date  . Anxiety   . Arthritis   . Chronic diarrhea   . Depression   . GERD (gastroesophageal reflux disease)   . Hard of hearing    left ear  . Hyperlipidemia   . Indigestion   . Irritable bowel   . Pancreatitis      Past Surgical History:  Procedure Laterality Date  . ANTERIOR CRUCIATE LIGAMENT REPAIR  yrs ago   left leg  . CHOLECYSTECTOMY  09/02/2011   Procedure: LAPAROSCOPIC CHOLECYSTECTOMY WITH INTRAOPERATIVE CHOLANGIOGRAM;  Surgeon: Earnstine Regal, MD;  Location: [facility];  Service: General;  Laterality: N/A;  Lap Chole with IOC    Family History  Problem Relation Age of Onset  . Prostate cancer Father   . Cirrhosis Father     alcohol use  . Crohn's disease Father   . Cancer Father     prostate  . Breast cancer Paternal Aunt   . Cancer Paternal Aunt     breast  . Crohn's disease Paternal Aunt   . Diabetes Maternal Grandmother   . Diabetes Paternal Grandmother   . Diabetes Cousin   . Diabetes Paternal Aunt   . Diabetes Maternal Uncle   . Cancer Maternal Uncle     colon  . Heart disease Maternal Grandfather   . Heart disease Maternal Uncle   . Heart disease Paternal Uncle   . Irritable bowel syndrome Paternal Aunt   . Irritable bowel syndrome Paternal Aunt   . Irritable bowel syndrome Paternal Aunt   . Irritable bowel syndrome Sister   . Prostate cancer Maternal Uncle   . Pancreatic cancer Maternal Uncle   .  Cancer Paternal Grandfather     prostate    Social History   Social History  . Marital status: Single    Spouse name: N/A  . Number of children: 0  . Years of education: N/A   Occupational History  . 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   Social History Main Topics  . Smoking status: Current Every Day Smoker    Packs/day: 0.75    Years: 10.00    Types: Cigarettes  . Smokeless tobacco: Never Used     Comment: tobacco handout given 2/20//2013  . Alcohol use No  . Drug use: No  . Sexual activity: Not on file   Other Topics Concern  . Not on file   Social History Narrative  . No narrative on file   The PMH, PSH, Social History, Family History, Medications, and allergies have been reviewed in [facility], and have been updated if relevant.   Review of Systems   Constitutional: Positive for fatigue. Negative for fever.  HENT: Negative.   Respiratory: Negative.   Cardiovascular: Negative.   Gastrointestinal: Positive for abdominal pain, constipation, diarrhea and nausea. Negative for vomiting.  Endocrine: Negative.   Genitourinary: Negative.   Musculoskeletal: Positive for arthralgias.  Neurological: Negative.   Hematological: Negative.   Psychiatric/Behavioral: Negative.   All other systems reviewed and are negative.      Objective:    BP 104/70   Pulse 71   Wt 166 lb 8 oz (75.5 kg)   SpO2 97%   BMI 28.58 kg/m    Physical Exam   General:  Well-developed,well-nourished,in no acute distress; alert,appropriate and cooperative throughout examination Head:  normocephalic and atraumatic.   Eyes:  vision grossly intact, PERRL Ears:  R ear normal and L ear normal externally, TMs clear bilaterally Nose:  no external deformity.   Mouth:  good dentition.   Neck:  No deformities, masses, or tenderness noted. Lungs:  Normal respiratory effort, chest expands symmetrically. Lungs are clear to auscultation, no crackles or wheezes. Heart:  Normal rate and regular rhythm. S1 and S2 normal without gallop, murmur, click, rub or other extra sounds. Abdomen:  Bowel sounds positive,abdomen soft and non-tender without masses, organomegaly or hernias noted. Msk:  No deformity or scoliosis noted of thoracic or lumbar spine.   Extremities:  No clubbing, cyanosis, edema, or deformity noted with normal full range of motion of all joints.   Neurologic:  alert & oriented X3 and gait normal.   Skin:  Intact without suspicious lesions or rashes Cervical Nodes:  No lymphadenopathy noted Axillary Nodes:  No palpable lymphadenopathy Psych:  Cognition and judgment appear intact. Alert and cooperative with normal attention span and concentration. No apparent delusions, illusions, hallucinations       Assessment & Plan:   Gastroesophageal reflux disease  without esophagitis - Plan: H. pylori antibody, IgG, Comprehensive metabolic panel, Lipase  Fatigue, unspecified type - Plan: CBC, TSH, Vitamin B12 No Follow-up on file.

## 2016-08-16 NOTE — Patient Instructions (Addendum)
Great to see you.  I will call you with your lab results.  We are starting Zyban for smoking cessation- please wait until I call you with your lab results before starting this.

## 2016-08-16 NOTE — Assessment & Plan Note (Signed)
Smoking cessation instruction/counseling given:  commended patient for quitting and reviewed strategies for preventing relapses. eRx sent for zyban.

## 2016-08-16 NOTE — Assessment & Plan Note (Signed)
With epigastric pain and fatigue. Will start with labs today- ? H Pylori. If labs normal, will advise more regular PPI or H2 blocker use and refer to GI for further work up/repeat endoscopy.

## 2017-09-27 ENCOUNTER — Emergency Department (HOSPITAL_COMMUNITY)
Admission: EM | Admit: 2017-09-27 | Discharge: 2017-09-28 | Discharge status: Against Medical Advice | Payer: BLUE CROSS/BLUE SHIELD

## 2017-09-28 NOTE — ED Notes (Signed)
Follow up call complete  Pt states feeling fine,  Using topical antibiotics   1135   09/28/17   s Constantine Ruddick rn

## 2018-05-24 ENCOUNTER — Other Ambulatory Visit: Payer: Self-pay | Admitting: Orthopedic Surgery

## 2018-05-24 DIAGNOSIS — M2341 Loose body in knee, right knee: Secondary | ICD-10-CM

## 2018-05-24 DIAGNOSIS — M25561 Pain in right knee: Secondary | ICD-10-CM

## 2018-05-24 DIAGNOSIS — M1711 Unilateral primary osteoarthritis, right knee: Secondary | ICD-10-CM

## 2018-06-04 ENCOUNTER — Ambulatory Visit
Admission: RE | Admit: 2018-06-04 | Discharge: 2018-06-04 | Disposition: A | Discharge status: Home / Self Care | Payer: Managed Care, Other (non HMO) | Source: Ambulatory Visit | Attending: Orthopedic Surgery | Admitting: Orthopedic Surgery

## 2018-06-04 DIAGNOSIS — M2341 Loose body in knee, right knee: Secondary | ICD-10-CM | POA: Insufficient documentation

## 2018-06-04 DIAGNOSIS — M1711 Unilateral primary osteoarthritis, right knee: Secondary | ICD-10-CM | POA: Insufficient documentation

## 2018-06-04 DIAGNOSIS — M25561 Pain in right knee: Secondary | ICD-10-CM | POA: Diagnosis not present

## 2018-08-15 ENCOUNTER — Ambulatory Visit: Admit: 2018-08-15 | Payer: Managed Care, Other (non HMO) | Admitting: Orthopedic Surgery

## 2018-08-15 SURGERY — ARTHROSCOPY, KNEE
Anesthesia: Choice | Laterality: Right

## 2018-10-15 NOTE — Progress Notes (Signed)
Virtual Visit via Video   Due to the COVID-19 pandemic, this visit was completed with telemedicine (audio/video) technology to reduce patient and provider exposure as well as to preserve personal protective equipment.   I connected with Sherri Dalton by a video enabled telemedicine application and verified that I am speaking with the correct person using two identifiers. Location patient: Home Location provider: [place], Office Persons participating in the virtual visit: Sherri Cobbs, MD   I discussed the limitations of evaluation and management by telemedicine and the availability of in person appointments. The patient expressed understanding and agreed to proceed.  Sherri Dalton   Patient Sherri Dalton: Sherri Passy, MD as PCP - General  Subjective:   HPI:   Low back pain- located midway down on her left side.  She did have a back injury 6 years ago but it flared up on Thursday (4 days ago) and has been "unbearable" since.  She can feel the muscle spasm.  No radiculopathy.  No LE weakness.  Pain can be as high as a 7-8.  It will catch her with sharp pain when she moves a certain way and she almost can't catch her breath due to the pain.  Has used alleve and heating pad which has not helped.  No known injury.      Review of Systems  Constitutional: Negative.   HENT: Negative.   Eyes: Negative.   Respiratory: Negative.   Cardiovascular: Negative.   Gastrointestinal: Negative.   Musculoskeletal: Positive for back pain.  Skin: Negative.   Neurological: Negative.  Negative for tingling, focal weakness and weakness.  Endo/Heme/Allergies: Negative.   Psychiatric/Behavioral: Negative.   All other systems reviewed and are negative.    Patient Active Problem List   Diagnosis Date Noted  . Lumbar strain, initial encounter 10/16/2018  . Fatigue 08/16/2016  . Tobacco abuse 08/16/2016  . Hypertriglyceridemia 06/01/2011  . HYPERTRIGLYCERIDEMIA 05/06/2010  .  ANXIETY 03/18/2010  . DEPRESSION 03/18/2010  . BLURRED VISION, INTERMITTENT 03/18/2010  . HEARING LOSS, BILATERAL 03/18/2010  . GERD 03/18/2010  . OSTEOARTHRITIS 03/18/2010  . PAIN IN JOINT, MULTIPLE SITES 03/18/2010    Social History   Tobacco Use  . Smoking status: Current Every Day Smoker    Packs/day: 0.75    Years: 10.00    Pack years: 7.50    Types: Cigarettes  . Smokeless tobacco: Never Used  . Tobacco comment: tobacco handout given 2/20//2013  Substance Use Topics  . Alcohol use: No   No current outpatient medications on file.  Allergies  Allergen Reactions  . Adhesive [Tape] Other (See Comments)    redness  . Bee Venom Swelling    Objective:  Blood pressure 120/60, temperature (!) 97.4 F (36.3 C), temperature source Oral.   VITALS: Per patient if applicable,.va see vitals. GENERAL: Alert, appears well and in no acute distress. HEENT: Atraumatic, conjunctiva clear, no obvious abnormalities on inspection of external nose and ears. NECK: Normal movements of the head and neck. CARDIOPULMONARY: No increased WOB. Speaking in clear sentences. I:E ratio WNL.  MS: Moves all visible extremities without noticeable abnormality. Patient appears to be in mild to moderate pain, antalgic gait noted. Lumbosacral spine area reveals no local tenderness or mass.  Painful and reduced LS ROM noted. Straight leg raise is negative at 45 degrees bilaterally. DTR's, motor strength and sensation normal, including heel and toe gait.  Peripheral pulses are palpable.  PSYCH: Pleasant and cooperative, well-groomed. Speech normal rate and  rhythm. Affect is appropriate. Insight and judgement are appropriate. Attention is focused, linear, and appropriate.  NEURO: CN grossly intact. Oriented as arrived to appointment on time with no prompting. Moves both UE equally.  SKIN: No obvious lesions, wounds, erythema, or cyanosis noted on face or hands.  No flowsheet data found.  Assessment and Plan:    Sherri Dalton was seen today for back pain.  Diagnoses and all orders for this visit:  Lumbar strain, initial encounter    . COVID-19 Education: The signs and symptoms of COVID-19 were discussed with the patient and how to seek Sherri for testing if needed. The importance of social distancing was discussed today. . Reviewed expectations re: course of current medical issues. . Discussed self-management of symptoms. . Outlined signs and symptoms indicating need for more acute intervention. . Patient verbalized understanding and all questions were answered. Marland Kitchen Health Maintenance issues including appropriate healthy diet, exercise, and smoking avoidance were discussed with patient. . See orders for this visit as documented in the electronic medical record.  Sherri Norris, MD  Records requested if needed. Time spent: 25 minutes, of which >50% was spent in obtaining information about her symptoms, reviewing her previous labs, evaluations, and treatments, counseling her about her condition (please see the discussed topics above), and developing a plan to further investigate it; she had a number of questions which I addressed.

## 2018-10-16 ENCOUNTER — Telehealth (INDEPENDENT_AMBULATORY_CARE_PROVIDER_SITE_OTHER): Payer: Managed Care, Other (non HMO) | Admitting: Family Medicine

## 2018-10-16 ENCOUNTER — Encounter: Payer: Self-pay | Admitting: Family Medicine

## 2018-10-16 ENCOUNTER — Telehealth: Payer: Self-pay

## 2018-10-16 DIAGNOSIS — S39012A Strain of muscle, fascia and tendon of lower back, initial encounter: Secondary | ICD-10-CM

## 2018-10-16 MED ORDER — CYCLOBENZAPRINE HCL 5 MG PO TABS: 5.0000 mg | ORAL_TABLET | Freq: Every evening | ORAL | 1 refills | Status: DC | PRN

## 2018-10-16 MED ORDER — PREDNISONE 10 MG PO TABS: ORAL_TABLET | ORAL | 0 refills | Status: DC

## 2018-10-16 NOTE — Assessment & Plan Note (Addendum)
For acute pain, rest, intermittent application of heat (do not sleep on heating pad), analgesics  These have not been effective.  eRx sent for cyclobenzaprine as needed at bedtime after discussing sedation precautions given.  eRx sent for prednisone burst as well.  Proper lifting with avoidance of heavy lifting discussed. Consider Physical Therapy and XRay studies if not improving. Call or return to clinic prn if these symptoms worsen or fail to improve as anticipated.  >25 minutes spent in face to face time with patient, >50% spent in counselling or coordination of care

## 2018-10-16 NOTE — Telephone Encounter (Signed)
I LMOVM for pt to RTC to go through check-in process for virtual visit with Dr. Aron/thx dmf

## 2018-12-17 ENCOUNTER — Other Ambulatory Visit: Payer: Self-pay | Admitting: Family Medicine

## 2019-02-22 ENCOUNTER — Other Ambulatory Visit: Payer: Self-pay | Admitting: Family Medicine

## 2019-03-10 ENCOUNTER — Ambulatory Visit: Admit: 2019-03-10 | Payer: Managed Care, Other (non HMO) | Admitting: Orthopedic Surgery

## 2019-03-10 SURGERY — ARTHROSCOPY, KNEE
Anesthesia: Choice | Laterality: Right

## 2019-05-28 ENCOUNTER — Other Ambulatory Visit: Payer: Self-pay

## 2019-05-28 MED ORDER — CYCLOBENZAPRINE HCL 5 MG PO TABS: 5.0000 mg | ORAL_TABLET | Freq: Every evening | ORAL | 1 refills | Status: DC | PRN

## 2019-05-28 NOTE — Telephone Encounter (Signed)
Last oV 11/05/18 Last fill 02/22/19  #30/1

## 2019-05-29 ENCOUNTER — Other Ambulatory Visit: Payer: Self-pay

## 2019-05-29 NOTE — Progress Notes (Signed)
Subjective:    Patient ID: Sherri Dalton, female    DOB: Sep 24, 1970, 49 y.o.   MRN: [id number]  Chief Complaint  Patient presents with  . Otalgia    HPI Patient is in today for otalgia and sinus pain. Past Medical History:  Diagnosis Date  . Anxiety   . Arthritis   . Chronic diarrhea   . Depression   . GERD (gastroesophageal reflux disease)   . Hard of hearing    left ear  . Hyperlipidemia   . Indigestion   . Irritable bowel   . Pancreatitis     Past Surgical History:  Procedure Laterality Date  . ANTERIOR CRUCIATE LIGAMENT REPAIR  yrs ago   left leg  . CHOLECYSTECTOMY  09/02/2011   Procedure: LAPAROSCOPIC CHOLECYSTECTOMY WITH INTRAOPERATIVE CHOLANGIOGRAM;  Surgeon: Earnstine Regal, MD;  Location: [facility];  Service: General;  Laterality: N/A;  Lap Chole with IOC    Family History  Problem Relation Age of Onset  . Prostate cancer Father   . Cirrhosis Father        alcohol use  . Crohn's disease Father   . Cancer Father        prostate  . Breast cancer Paternal Aunt   . Cancer Paternal Aunt        breast  . Crohn's disease Paternal Aunt   . Diabetes Maternal Grandmother   . Diabetes Paternal Grandmother   . Diabetes Cousin   . Diabetes Paternal Aunt   . Diabetes Maternal Uncle   . Cancer Maternal Uncle        colon  . Heart disease Maternal Grandfather   . Heart disease Maternal Uncle   . Heart disease Paternal Uncle   . Irritable bowel syndrome Paternal Aunt   . Irritable bowel syndrome Paternal Aunt   . Irritable bowel syndrome Paternal Aunt   . Irritable bowel syndrome Sister   . Prostate cancer Maternal Uncle   . Pancreatic cancer Maternal Uncle   . Cancer Paternal Grandfather        prostate    Social History   Socioeconomic History  . Marital status: Single    Spouse name: Not on file  . Number of children: 0  . Years of education: Not on file  . Highest education level: Not on file  Occupational History  . Occupation: Nurse, adult: Forensic scientist.  Tobacco Use  . Smoking status: Current Every Day Smoker    Packs/day: 0.75    Years: 10.00    Pack years: 7.50    Types: Cigarettes  . Smokeless tobacco: Never Used  . Tobacco comment: tobacco handout given 2/20//2013  Substance and Sexual Activity  . Alcohol use: No  . Drug use: No  . Sexual activity: Not on file  Other Topics Concern  . Not on file  Social History Narrative  . Not on file   Social Determinants of Health   Financial Resource Strain:   . Difficulty of Paying Living Expenses: Not on file  Food Insecurity:   . Worried About Charity fundraiser in the Last Year: Not on file  . Ran Out of Food in the Last Year: Not on file  Transportation Needs:   . Lack of Transportation (Medical): Not on file  . Lack of Transportation (Non-Medical): Not on file  Physical Activity:   . Days of Exercise per Week: Not on file  . Minutes of Exercise per Session: Not on file  Stress:   . Feeling of Stress : Not on file  Social Connections:   . Frequency of Communication with Friends and Family: Not on file  . Frequency of Social Gatherings with Friends and Family: Not on file  . Attends Religious Services: Not on file  . Active Member of Clubs or Organizations: Not on file  . Attends Archivist Meetings: Not on file  . Marital Status: Not on file  Intimate Partner Violence:   . Fear of Current or Ex-Partner: Not on file  . Emotionally Abused: Not on file  . Physically Abused: Not on file  . Sexually Abused: Not on file    Outpatient Medications Prior to Visit  Medication Sig Dispense Refill  . cyclobenzaprine (FLEXERIL) 5 MG tablet Take 1 tablet (5 mg total) by mouth at bedtime as needed for muscle spasms. 30 tablet 1  . predniSONE (DELTASONE) 10 MG tablet 3 tabs by mouth x 3 days, 2 tabs by mouth x 2 days, 1 tab by mouth x 2 days and stop. 15 tablet 0   No facility-administered medications prior to visit.    Allergies    Allergen Reactions  . Adhesive [Tape] Other (See Comments)    redness  . Bee Venom Swelling    Review of Systems  Constitutional: Negative for fever.  HENT: Positive for congestion, ear pain and sinus pain. Negative for ear discharge and sore throat.   Eyes: Negative for blurred vision and discharge.  Respiratory: Positive for cough. Negative for wheezing.   Cardiovascular: Negative for chest pain.  Gastrointestinal: Negative for heartburn.  Genitourinary: Negative for dysuria.  Musculoskeletal: Negative for falls and myalgias.  Skin: Negative for rash.  Neurological: Negative for dizziness and loss of consciousness.  Endo/Heme/Allergies: Does not bruise/bleed easily.  Psychiatric/Behavioral: Negative for depression and memory loss.       Objective:    Physical Exam Vitals and nursing note reviewed.  Constitutional:      Appearance: Normal appearance. She is not ill-appearing.  HENT:     Head: Normocephalic and atraumatic.     Right Ear: External ear normal. A middle ear effusion is present. There is no impacted cerumen. Tympanic membrane is erythematous.     Left Ear: External ear normal. A middle ear effusion is present. There is no impacted cerumen. Tympanic membrane is bulging.     Nose:     Right Sinus: Maxillary sinus tenderness and frontal sinus tenderness present.     Left Sinus: Frontal sinus tenderness present.  Eyes:     General:        Right eye: No discharge.        Left eye: No discharge.  Cardiovascular:     Rate and Rhythm: Normal rate and regular rhythm.     Heart sounds: Normal heart sounds.  Pulmonary:     Effort: Pulmonary effort is normal.     Breath sounds: No wheezing.  Abdominal:     Palpations: Abdomen is soft. There is no mass.     Tenderness: There is no abdominal tenderness. There is no guarding.  Musculoskeletal:        General: Normal range of motion.     Right lower leg: No edema.     Left lower leg: No edema.  Skin:    General:  Skin is dry.  Neurological:     Mental Status: She is alert and oriented to person, place, and time.     Deep Tendon Reflexes: Reflexes normal.  Psychiatric:        Mood and Affect: Mood normal.        Behavior: Behavior normal.     BP 126/90 (BP Location: Left Arm, Patient Position: Sitting, Cuff Size: Large)   Pulse 68   Temp (!) 97.3 F (36.3 C) (Temporal)   Wt 183 lb 9.6 oz (83.3 kg)   SpO2 99%   BMI 31.51 kg/m  Wt Readings from Last 3 Encounters:  05/30/19 183 lb 9.6 oz (83.3 kg)  06/04/18 170 lb (77.1 kg)  08/16/16 166 lb 8 oz (75.5 kg)     [facility] Results  Component Value Date   WBC 9.5 08/16/2016   HGB 15.2 (H) 08/16/2016   HCT 43.0 08/16/2016   PLT 317.0 08/16/2016   GLUCOSE 95 08/16/2016   CHOL 273 (H) 06/08/2011   TRIG 134.0 07/07/2011   HDL 33.60 (L) 06/08/2011   LDLDIRECT 206.5 06/08/2011   ALT 19 08/16/2016   AST 15 08/16/2016   NA 138 08/16/2016   K 4.1 08/16/2016   CL 102 08/16/2016   CREATININE 0.70 08/16/2016   BUN 12 08/16/2016   CO2 28 08/16/2016   TSH 2.51 08/16/2016    [facility] Results  Component Value Date   TSH 2.51 08/16/2016   [facility] Results  Component Value Date   WBC 9.5 08/16/2016   HGB 15.2 (H) 08/16/2016   HCT 43.0 08/16/2016   MCV 89.5 08/16/2016   PLT 317.0 08/16/2016   [facility] Results  Component Value Date   NA 138 08/16/2016   K 4.1 08/16/2016   CO2 28 08/16/2016   GLUCOSE 95 08/16/2016   BUN 12 08/16/2016   CREATININE 0.70 08/16/2016   BILITOT 0.3 08/16/2016   ALKPHOS 51 08/16/2016   AST 15 08/16/2016   ALT 19 08/16/2016   PROT 7.3 08/16/2016   ALBUMIN 4.3 08/16/2016   CALCIUM 10.1 08/16/2016   GFR 96.35 08/16/2016   [facility] Results  Component Value Date   CHOL 273 (H) 06/08/2011   [facility] Results  Component Value Date   HDL 33.60 (L) 06/08/2011   No results found for: [facility] Results  Component Value Date   TRIG 134.0 07/07/2011   [facility] Results  Component Value Date   CHOLHDL 8 06/08/2011   No results found  for: HGBA1C     Assessment & Plan:   Problem List Items Addressed This Visit      Active Problems   URI (upper respiratory infection)    Having URI symptoms for about a month- started with sinus pressure 1 month ago.  Tried flonase but symptoms persist.     She has frontal, temporal, periorbita and maxillary sinus pressure. Her ears started hurting 2-weeks ago. Ears are clear. Neck started hurting on left side 1.5-weeks-ago then the right side started hurting 2-days-ago. Has slight cough due to post-nasal-drip with clear-white sputum. She has white nasal mucous with tinges of blood.  Also having bilateral ear pain.           I have discontinued [facility] Aaron's predniSONE. I am also having her maintain her cyclobenzaprine.  No orders of the defined types were placed in this encounter.   This visit occurred during the SARS-CoV-2 public health emergency.  Safety protocols were in place, including screening questions prior to the visit, additional usage of staff PPE, and extensive cleaning of exam room while observing appropriate contact time as indicated for disinfecting solutions.    Arnette Norris, MD

## 2019-05-30 ENCOUNTER — Ambulatory Visit: Payer: Managed Care, Other (non HMO) | Admitting: Family Medicine

## 2019-05-30 ENCOUNTER — Encounter: Payer: Self-pay | Admitting: Family Medicine

## 2019-05-30 DIAGNOSIS — J019 Acute sinusitis, unspecified: Secondary | ICD-10-CM | POA: Diagnosis not present

## 2019-05-30 DIAGNOSIS — J069 Acute upper respiratory infection, unspecified: Secondary | ICD-10-CM

## 2019-05-30 DIAGNOSIS — H6693 Otitis media, unspecified, bilateral: Secondary | ICD-10-CM

## 2019-05-30 MED ORDER — AMOXICILLIN-POT CLAVULANATE 875-125 MG PO TABS: 1.0000 | ORAL_TABLET | Freq: Two times a day (BID) | ORAL | 0 refills | Status: DC

## 2019-05-30 NOTE — Patient Instructions (Signed)
Great to see you.  Take Augmentin 1 tablet twice daily x 10 days  ( with food).  Continue flonase, okay to add a zyrtec or mucinex d if your stomach can tolerate it.

## 2019-05-30 NOTE — Assessment & Plan Note (Addendum)
Having URI symptoms for about a month- started with sinus pressure 1 month ago.  Tried flonase but symptoms persist.     She has frontal, temporal, periorbita and maxillary sinus pressure. Her ears started hurting 2-weeks ago. Ears are clear. Neck started hurting on left side 1.5-weeks-ago then the right side started hurting 2-days-ago. Has slight cough due to post-nasal-drip with clear-white sputum. She has white nasal mucous with tinges of blood.  Also having bilateral ear pain.   Exam showing bilateral otitis media and sinusitis. Given duration and progression of symptoms, will treat for bacterial sinusitis and otitis media with augmentin.

## 2019-06-08 ENCOUNTER — Telehealth: Payer: Self-pay | Admitting: Family Medicine

## 2019-06-08 NOTE — Telephone Encounter (Signed)
TA-I spoke to pt/she states that since her visit with you she has been getting better but it seems like the Sx are starting to return and the ear issues have not gone away all the way yet/the first available VV is Tuesday/she is requesting to either extend current abx till that VV or to do a different abx/plz advise/thx dmf

## 2019-06-08 NOTE — Telephone Encounter (Signed)
Unfortunately, she will need to be seen or at least have a virtual visit first. Thank you.

## 2019-06-08 NOTE — Telephone Encounter (Signed)
Pt called and said she still has an ear infection but she is almost out of the antibiotic that she was prescribed, she wanted to know if you still wanted her to continue taking it and if so you if you can send it in

## 2019-06-10 MED ORDER — AMOXICILLIN-POT CLAVULANATE 875-125 MG PO TABS: 1.0000 | ORAL_TABLET | Freq: Two times a day (BID) | ORAL | 0 refills | Status: AC

## 2019-06-10 NOTE — Telephone Encounter (Signed)
Noted. Since she has had improvement, let's repeat course of current abx.  She can cancel visit and follow up only if symptoms do not improve.  eRx refill sent to pharmacy on file.

## 2019-09-17 ENCOUNTER — Other Ambulatory Visit (HOSPITAL_COMMUNITY)
Admission: RE | Admit: 2019-09-17 | Discharge: 2019-09-17 | Disposition: A | Discharge status: Home / Self Care | Payer: Managed Care, Other (non HMO) | Source: Ambulatory Visit | Attending: Nurse Practitioner | Admitting: Nurse Practitioner

## 2019-09-17 ENCOUNTER — Encounter: Payer: Self-pay | Admitting: Nurse Practitioner

## 2019-09-17 ENCOUNTER — Other Ambulatory Visit: Payer: Self-pay

## 2019-09-17 ENCOUNTER — Ambulatory Visit: Payer: Managed Care, Other (non HMO) | Admitting: Nurse Practitioner

## 2019-09-17 VITALS — BP 126/84 | HR 55 | Temp 96.0°F | Ht 64.0 in | Wt 176.6 lb

## 2019-09-17 DIAGNOSIS — E781 Pure hyperglyceridemia: Secondary | ICD-10-CM

## 2019-09-17 DIAGNOSIS — Z124 Encounter for screening for malignant neoplasm of cervix: Secondary | ICD-10-CM | POA: Diagnosis not present

## 2019-09-17 DIAGNOSIS — R21 Rash and other nonspecific skin eruption: Secondary | ICD-10-CM | POA: Diagnosis not present

## 2019-09-17 DIAGNOSIS — B9689 Other specified bacterial agents as the cause of diseases classified elsewhere: Secondary | ICD-10-CM

## 2019-09-17 DIAGNOSIS — N859 Noninflammatory disorder of uterus, unspecified: Secondary | ICD-10-CM

## 2019-09-17 DIAGNOSIS — N950 Postmenopausal bleeding: Secondary | ICD-10-CM | POA: Diagnosis present

## 2019-09-17 DIAGNOSIS — E7800 Pure hypercholesterolemia, unspecified: Secondary | ICD-10-CM | POA: Diagnosis not present

## 2019-09-17 DIAGNOSIS — N939 Abnormal uterine and vaginal bleeding, unspecified: Secondary | ICD-10-CM

## 2019-09-17 DIAGNOSIS — N760 Acute vaginitis: Secondary | ICD-10-CM

## 2019-09-17 LAB — BASIC METABOLIC PANEL
BUN: 11 mg/dL (ref 6–23)
CO2: 27 mEq/L (ref 19–32)
Calcium: 9.4 mg/dL (ref 8.4–10.5)
Chloride: 105 mEq/L (ref 96–112)
Creatinine, Ser: 0.9 mg/dL (ref 0.40–1.20)
GFR: 66.92 mL/min (ref >60.00)
Glucose, Bld: 113 mg/dL — ABNORMAL HIGH (ref 70–99)
Potassium: 4.1 mEq/L (ref 3.5–5.1)
Sodium: 139 mEq/L (ref 135–145)

## 2019-09-17 LAB — LIPID PANEL
Cholesterol: 198 mg/dL (ref 0–200)
HDL: 55.9 mg/dL (ref >39.00)
LDL Cholesterol: 128 mg/dL — ABNORMAL HIGH (ref 0–99)
NonHDL: 141.71
Total CHOL/HDL Ratio: 4
Triglycerides: 69 mg/dL (ref 0.0–149.0)
VLDL: 13.8 mg/dL (ref 0.0–40.0)

## 2019-09-17 LAB — TSH: TSH: 1.02 u[IU]/mL (ref 0.35–4.50)

## 2019-09-17 LAB — CBC
HCT: 43.5 % (ref 36.0–46.0)
Hemoglobin: 14.8 g/dL (ref 12.0–15.0)
MCHC: 34.1 g/dL (ref 30.0–36.0)
MCV: 89.9 fl (ref 78.0–100.0)
Platelets: 182 10*3/uL (ref 150.0–400.0)
RBC: 4.83 Mil/uL (ref 3.87–5.11)
RDW: 12.8 % (ref 11.5–15.5)
WBC: 4.9 10*3/uL (ref 4.0–10.5)

## 2019-09-17 MED ORDER — TRIAMCINOLONE ACETONIDE 0.5 % EX OINT: 1.0000 "application " | TOPICAL_OINTMENT | Freq: Two times a day (BID) | CUTANEOUS | 1 refills | Status: DC

## 2019-09-17 NOTE — Patient Instructions (Addendum)
Go to lab for urine collection and blood draw Will order pelvic US if normal lab results.  Call office if rash does not improve in 1week.

## 2019-09-17 NOTE — Progress Notes (Addendum)
Subjective:  Patient ID: Sherri Dalton, female    DOB: Apr 18, 1971  Age: 49 y.o. MRN: [id number]  CC: Establish Care (TOC from Dr. Aron//discuss menopause-pt not had a period in a yer and half and started spotting Friday, [place] rash on legs since Easter-thought it was due to a yeast infection she has)  Vaginal Bleeding The patient's primary symptoms include genital itching, vaginal bleeding and vaginal discharge. The patient's pertinent negatives include no genital lesions, genital odor, genital rash, missed menses or pelvic pain. This is a new problem. The current episode started in the past 7 days. The problem occurs constantly. The problem has been unchanged. The patient is experiencing no pain. She is not pregnant. Pertinent negatives include no abdominal pain, anorexia, back pain, chills, constipation, diarrhea, discolored urine, dysuria, fever, flank pain, frequency, headaches, hematuria, joint pain, joint swelling, nausea, rash, sore throat, urgency or vomiting. The vaginal discharge was thin and clear. The vaginal bleeding is spotting. She has not been passing clots. She has not been passing tissue. Nothing aggravates the symptoms. She has tried nothing for the symptoms. She is not sexually active. She uses abstinence for contraception. There is no history of PID, an STD or vaginosis.  LMP 2019.  Reviewed past Medical, Social and Family history today.  Outpatient Medications Prior to Visit  Medication Sig Dispense Refill  . cyclobenzaprine (FLEXERIL) 5 MG tablet Take 1 tablet (5 mg total) by mouth at bedtime as needed for muscle spasms. 30 tablet 1   No facility-administered medications prior to visit.    ROS See HPI  Objective:  BP 126/84   Pulse (!) 55   Temp (!) 96 F (35.6 C) (Tympanic)   Ht 5\' 4"  (1.626 m)   Wt 176 lb 9.6 oz (80.1 kg)   SpO2 98%   BMI 30.31 kg/m   BP Readings from Last 3 Encounters:  09/17/19 126/84  05/30/19 126/90  10/16/18 120/60    Wt  Readings from Last 3 Encounters:  09/17/19 176 lb 9.6 oz (80.1 kg)  05/30/19 183 lb 9.6 oz (83.3 kg)  06/04/18 170 lb (77.1 kg)   Physical Exam Exam conducted with a chaperone present.  Cardiovascular:     Rate and Rhythm: Normal rate.     Pulses: Normal pulses.  Pulmonary:     Effort: Pulmonary effort is normal.  Abdominal:     Palpations: Abdomen is soft.  Genitourinary:    Labia:        Right: No rash, tenderness or lesion.        Left: No rash, tenderness or lesion.      Urethra: No prolapse or urethral pain.     Vagina: Vaginal discharge and bleeding present. No erythema, tenderness or lesions.     Cervix: Discharge and cervical bleeding present. No cervical motion tenderness, friability, lesion or erythema.     Uterus: Normal.      Adnexa: Right adnexa normal and left adnexa normal.  Lymphadenopathy:     Lower Body: No right inguinal adenopathy. No left inguinal adenopathy.  Skin:    Findings: Rash present.  Neurological:     Mental Status: She is oriented to person, place, and time.  Psychiatric:        Mood and Affect: Mood normal.        Behavior: Behavior normal.     [facility] Results  Component Value Date   WBC 4.9 09/17/2019   HGB 14.8 09/17/2019   HCT 43.5 09/17/2019   PLT  182.0 09/17/2019   GLUCOSE 113 (H) 09/17/2019   CHOL 198 09/17/2019   TRIG 69.0 09/17/2019   HDL 55.90 09/17/2019   LDLDIRECT 206.5 06/08/2011   LDLCALC 128 (H) 09/17/2019   ALT 19 08/16/2016   AST 15 08/16/2016   NA 139 09/17/2019   K 4.1 09/17/2019   CL 105 09/17/2019   CREATININE 0.90 09/17/2019   BUN 11 09/17/2019   CO2 27 09/17/2019   TSH 1.02 09/17/2019    Assessment & Plan:  This visit occurred during the SARS-CoV-2 public health emergency.  Safety protocols were in place, including screening questions prior to the visit, additional usage of staff PPE, and extensive cleaning of exam room while observing appropriate contact time as indicated for disinfecting solutions.    Georgina was seen today for establish care.  Diagnoses and all orders for this visit:  Abnormal uterine bleeding due to disorder of endometrium -     Urinalysis w microscopic + reflex cultur -     Cervicovaginal ancillary only( [place]) -     Basic metabolic panel -     CBC -     REFLEXIVE URINE CULTURE -     US Pelvic Complete With Transvaginal; Future  Pure hypercholesterolemia -     TSH -     Lipid panel -     Ambulatory referral to Gynecology  Encounter for Papanicolaou smear for cervical cancer screening -     Cytology - PAP( [place])  Rash -     triamcinolone ointment (KENALOG) 0.5 %; Apply 1 application topically 2 (two) times daily.  BV (bacterial vaginosis) -     metroNIDAZOLE (METROGEL VAGINAL) 0.75 % vaginal gel; Place 1 Applicatorful vaginally at bedtime.   I am having [facility] [facility]. Latu start on triamcinolone ointment and metroNIDAZOLE. I am also having her maintain her cyclobenzaprine.  Meds ordered this encounter  Medications  . triamcinolone ointment (KENALOG) 0.5 %    Sig: Apply 1 application topically 2 (two) times daily.    Dispense:  30 g    Refill:  1    Order Specific Question:   Supervising Provider    Answer:   Ronnald Nian [id number]  . metroNIDAZOLE (METROGEL VAGINAL) 0.75 % vaginal gel    Sig: Place 1 Applicatorful vaginally at bedtime.    Dispense:  70 g    Refill:  0    Order Specific Question:   Supervising Provider    Answer:   Ronnald Nian [id number]    Problem List Items Addressed This Visit      Other   Hypertriglyceridemia    Other Visit Diagnoses    Abnormal uterine bleeding due to disorder of endometrium    -  Primary   Relevant Orders   Urinalysis w microscopic + reflex cultur (Completed)   Cervicovaginal ancillary only( [place]) (Completed)   Basic metabolic panel (Completed)   CBC (Completed)   REFLEXIVE URINE CULTURE (Completed)   US Pelvic Complete With Transvaginal (Completed)   Encounter  for Papanicolaou smear for cervical cancer screening       Relevant Orders   Cytology - PAP( [place]) (Completed)   Rash       Relevant Medications   triamcinolone ointment (KENALOG) 0.5 %   BV (bacterial vaginosis)       Relevant Medications   metroNIDAZOLE (METROGEL VAGINAL) 0.75 % vaginal gel      Follow-up: Return if symptoms worsen or fail to improve.  Wilfred Lacy, NP

## 2019-09-18 LAB — URINALYSIS W MICROSCOPIC + REFLEX CULTURE
Bacteria, UA: NONE SEEN /HPF
Bilirubin Urine: NEGATIVE
Glucose, UA: NEGATIVE
Hyaline Cast: NONE SEEN /LPF
Ketones, ur: NEGATIVE
Leukocyte Esterase: NEGATIVE
Nitrites, Initial: NEGATIVE
Protein, ur: NEGATIVE
Specific Gravity, Urine: 1.026 (ref 1.001–1.03)
WBC, UA: NONE SEEN /HPF (ref 0–5)
pH: <=5 (ref 5.0–8.0)

## 2019-09-18 LAB — CERVICOVAGINAL ANCILLARY ONLY
Bacterial Vaginitis (gardnerella): POSITIVE — AB
Candida Glabrata: NEGATIVE
Candida Vaginitis: NEGATIVE
Comment: NEGATIVE
Comment: NEGATIVE
Comment: NEGATIVE

## 2019-09-18 LAB — NO CULTURE INDICATED

## 2019-09-18 MED ORDER — METRONIDAZOLE 0.75 % VA GEL: 1.0000 | Freq: Every day | VAGINAL | 0 refills | Status: DC

## 2019-09-18 NOTE — Addendum Note (Signed)
Addended by: Wilfred Lacy L on: 09/18/2019 10:19 AM   Modules accepted: Orders

## 2019-09-19 LAB — CYTOLOGY - PAP
Comment: NEGATIVE
Diagnosis: NEGATIVE
Diagnosis: REACTIVE
High risk HPV: NEGATIVE

## 2019-09-27 ENCOUNTER — Ambulatory Visit
Admission: RE | Admit: 2019-09-27 | Discharge: 2019-09-27 | Disposition: A | Discharge status: Home / Self Care | Payer: Managed Care, Other (non HMO) | Source: Ambulatory Visit | Attending: Nurse Practitioner | Admitting: Nurse Practitioner

## 2019-09-27 DIAGNOSIS — N950 Postmenopausal bleeding: Secondary | ICD-10-CM

## 2019-09-28 ENCOUNTER — Telehealth: Payer: Self-pay | Admitting: Nurse Practitioner

## 2019-09-28 NOTE — Telephone Encounter (Signed)
Ok, thank you

## 2019-09-28 NOTE — Telephone Encounter (Signed)
Sherri Dalton.  [facility] called to let us know the pelvic ultrasound is in pt's chart.

## 2019-09-28 NOTE — Telephone Encounter (Signed)
Call report from [facility]: To notify Sherri Dalton that the Pelvic ultrasound in in the patient's chart.

## 2019-09-28 NOTE — Addendum Note (Signed)
Addended by: Wilfred Lacy L on: 09/28/2019 10:00 AM   Modules accepted: Orders

## 2019-10-03 ENCOUNTER — Telehealth: Payer: Self-pay

## 2019-10-03 MED ORDER — CYCLOBENZAPRINE HCL 5 MG PO TABS: 5.0000 mg | ORAL_TABLET | Freq: Every evening | ORAL | 0 refills | Status: DC | PRN

## 2019-10-03 NOTE — Addendum Note (Signed)
Addended by: Lucila Maine on: 10/03/2019 04:10 PM   Modules accepted: Orders

## 2019-10-03 NOTE — Telephone Encounter (Signed)
Ok to send 21tabs, no refill

## 2019-10-03 NOTE — Telephone Encounter (Signed)
[place]otte please advise.  Pt asking for refill for:  Cyclobenzaprine 5mg  30 tabs  1-refill Last filled-05/28/19 Last OV- 09/17/19

## 2019-10-03 NOTE — Telephone Encounter (Signed)
RX sent and pt notified.

## 2019-10-17 ENCOUNTER — Other Ambulatory Visit: Payer: Self-pay

## 2019-10-18 ENCOUNTER — Ambulatory Visit (INDEPENDENT_AMBULATORY_CARE_PROVIDER_SITE_OTHER): Payer: Managed Care, Other (non HMO) | Admitting: Nurse Practitioner

## 2019-10-18 ENCOUNTER — Encounter: Payer: Self-pay | Admitting: Nurse Practitioner

## 2019-10-18 VITALS — BP 132/80 | Ht 64.0 in | Wt 176.0 lb

## 2019-10-18 DIAGNOSIS — N644 Mastodynia: Secondary | ICD-10-CM

## 2019-10-18 DIAGNOSIS — N939 Abnormal uterine and vaginal bleeding, unspecified: Secondary | ICD-10-CM

## 2019-10-18 DIAGNOSIS — N951 Menopausal and female climacteric states: Secondary | ICD-10-CM

## 2019-10-18 DIAGNOSIS — R350 Frequency of micturition: Secondary | ICD-10-CM | POA: Diagnosis not present

## 2019-10-18 LAB — FOLLICLE STIMULATING HORMONE: FSH: 23.7 m[IU]/mL

## 2019-10-18 MED ORDER — MEDROXYPROGESTERONE ACETATE 10 MG PO TABS: 10.0000 mg | ORAL_TABLET | Freq: Every day | ORAL | 0 refills | Status: DC

## 2019-10-18 NOTE — Patient Instructions (Signed)
Take provera for 10 days, if no withdrawal bleeding by day 20 from today call office. If you have cycle call to schedule ultrasound 1-2 weeks after cycle. If still bleeding after 1-1.5 weeks call office and we will prescribe medication to stop bleeding for ultrasound.

## 2019-10-18 NOTE — Progress Notes (Signed)
Acute Office Visit  Subjective:    Patient ID: Sherri Dalton, female    DOB: 23-Jan-1971, 49 y.o.   MRN: [id number]  Chief Complaint  Patient presents with  . New Patient (Initial Visit)    jk,abnormal vaginal bleeding  . breast tenderness  . left abdominal pain  . Urinary Frequency  . Constipation    HPI 49 y.o. presents today as a new patient for abnormal uterine bleeding that began about a month ago. Seen by PCP 09/17/2018 where she reported bleeding after no cycle for 1.5 years. Initially started as spotting but had 8 days of heavy bleeding with clots that required tampons, then spotting of brown discharge. No HRT, had menopausal symptoms but these have improved.  She is not sexually active.  Ultrasound 09/27/2019 showed 15 mm left ovarian cyst and thickened endometrial complex 11 mm. She also complains of left breast pain that began around the same time as the bleeding. It is tender to palpation, deep under/below nipple. She has noticed some urinary frequency but says she consumes large amounts of coffee.    Review of Systems  Constitutional: Negative.   Gastrointestinal: Positive for abdominal pain (left lower) and constipation. Negative for abdominal distention, blood in stool, diarrhea, nausea and vomiting.  Genitourinary: Positive for frequency and vaginal bleeding. Negative for difficulty urinating, dysuria, flank pain, hematuria, pelvic pain, urgency and vaginal discharge.       Objective:    Physical Exam Constitutional:      Appearance: Normal appearance.  Chest:     Breasts:        Right: No swelling, inverted nipple, mass, nipple discharge, skin change or tenderness.        Left: Tenderness present. No swelling, inverted nipple, mass, nipple discharge or skin change.    Abdominal:     Palpations: Abdomen is soft.     Tenderness: There is no abdominal tenderness. There is no right CVA tenderness or left CVA tenderness.  Genitourinary:    General: Normal  vulva.     Vagina: Normal. No vaginal discharge, erythema, bleeding or prolapsed vaginal walls.     Cervix: Normal.     Uterus: Normal. Not enlarged, not fixed, not tender and no uterine prolapse.      BP 132/80 (BP Location: Right Arm, Patient Position: Sitting, Cuff Size: Normal)   Ht 5\' 4"  (1.626 m)   Wt 176 lb (79.8 kg)   BMI 30.21 kg/m  Wt Readings from Last 3 Encounters:  10/18/19 176 lb (79.8 kg)  09/17/19 176 lb 9.6 oz (80.1 kg)  05/30/19 183 lb 9.6 oz (83.3 kg)   UA: Trace blood, negative leukocyte, WBC 0-5, bacteria moderate     Assessment & Plan:   Problem List Items Addressed This Visit    None    Visit Diagnoses    Urinary frequency    -  Primary   Relevant Orders   Urinalysis,Complete w/RFL Culture   Abnormal uterine bleeding       Relevant Medications   medroxyPROGESTERone (PROVERA) 10 MG tablet   Menopausal symptoms       Relevant Orders   FSH   Pain of left breast         Plan: We will trial Provera 10 mg daily for 10 days.  If she experiences withdrawal bleeding she will call to schedule an ultrasound to evaluate endometrial thickness.  It is possible she is perimenopausal and has been experiencing a cycle or estrogen surge.  If she  does not have withdrawal bleeding we schedule her for an endometrial biopsy.  [place] today.  UA unremarkable.  Will send referral for diagnostic mammogram of left breast for breast pain. She is agreeable to plan.      Tamela Gammon [facility], 3:55 PM 10/18/2019

## 2019-10-19 ENCOUNTER — Encounter: Payer: Self-pay | Admitting: *Deleted

## 2019-10-19 ENCOUNTER — Telehealth: Payer: Self-pay | Admitting: *Deleted

## 2019-10-19 DIAGNOSIS — N644 Mastodynia: Secondary | ICD-10-CM

## 2019-10-19 NOTE — Telephone Encounter (Signed)
I left message for patient to call to find out if any breast imaging done elsewhere. No copies of reports in chart.

## 2019-10-19 NOTE — Telephone Encounter (Signed)
-----   Message from Tamela Gammon, NP sent at 10/18/2019  3:48 PM EDT ----- Please schedule diagnostic mammogram for left breast pain

## 2019-10-19 NOTE — Telephone Encounter (Addendum)
Patient called back stating she has never had screening mammogram. Patient scheduled at breast center on 11/02/19 @ 7:40am. My chart message sent.

## 2019-10-20 LAB — URINALYSIS, COMPLETE W/RFL CULTURE
Bilirubin Urine: NEGATIVE
Glucose, UA: NEGATIVE
Hyaline Cast: NONE SEEN /LPF
Leukocyte Esterase: NEGATIVE
Nitrites, Initial: NEGATIVE
Protein, ur: NEGATIVE
Specific Gravity, Urine: 1.025 (ref 1.001–1.03)
pH: 5.5 (ref 5.0–8.0)

## 2019-10-20 LAB — CULTURE INDICATED

## 2019-10-20 LAB — URINE CULTURE
MICRO NUMBER:: 10576280
SPECIMEN QUALITY:: ADEQUATE

## 2019-10-31 ENCOUNTER — Other Ambulatory Visit: Payer: Self-pay | Admitting: Nurse Practitioner

## 2019-11-02 ENCOUNTER — Ambulatory Visit
Admission: RE | Admit: 2019-11-02 | Discharge: 2019-11-02 | Disposition: A | Discharge status: Home / Self Care | Payer: Managed Care, Other (non HMO) | Source: Ambulatory Visit | Attending: Nurse Practitioner | Admitting: Nurse Practitioner

## 2019-11-02 ENCOUNTER — Other Ambulatory Visit: Payer: Self-pay | Admitting: Nurse Practitioner

## 2019-11-02 ENCOUNTER — Other Ambulatory Visit: Payer: Self-pay

## 2019-11-02 DIAGNOSIS — N644 Mastodynia: Secondary | ICD-10-CM

## 2019-11-02 DIAGNOSIS — N63 Unspecified lump in unspecified breast: Secondary | ICD-10-CM

## 2019-11-02 DIAGNOSIS — N6489 Other specified disorders of breast: Secondary | ICD-10-CM

## 2019-11-21 ENCOUNTER — Telehealth: Payer: Self-pay

## 2019-11-21 ENCOUNTER — Other Ambulatory Visit: Payer: Self-pay

## 2019-11-21 ENCOUNTER — Telehealth: Payer: Self-pay | Admitting: Nurse Practitioner

## 2019-11-21 DIAGNOSIS — N950 Postmenopausal bleeding: Secondary | ICD-10-CM

## 2019-11-21 NOTE — Telephone Encounter (Signed)
Patient is calling and wanted to speak to someone regarding getting an ultrasound order for her uterus, please advise. CB is [phone]

## 2019-11-21 NOTE — Telephone Encounter (Signed)
Looks like pts GYN placed an order for [place]

## 2019-11-21 NOTE — Telephone Encounter (Signed)
Patient called to report that 7 days after taking Provera she did have "a period".  Per TW's office note if withdrawal bleeding after Provera to call back to office and schedule u/s.  Order placed and message sent to [facility] at appt desk to call her to arrange u/s and visit with TW.

## 2019-11-21 NOTE — Telephone Encounter (Signed)
Dr C please advise.  Pt stated she had labs with US done, which hasn't been resulted yet, but Baldo Ash had notes if they were normal she would order a US Pelvic Complete With Transvaginal, because of abnormal uterine bleeding.   Pt asking if she needs to have the US done and asking for what her labs mean.

## 2019-11-23 NOTE — Telephone Encounter (Signed)
Pt was notified and did understand that her GYN put her a referral in an shes scheduled.

## 2019-11-29 ENCOUNTER — Ambulatory Visit: Payer: Managed Care, Other (non HMO) | Admitting: Nurse Practitioner

## 2019-11-29 ENCOUNTER — Other Ambulatory Visit: Payer: Self-pay

## 2019-11-29 ENCOUNTER — Encounter: Payer: Self-pay | Admitting: Nurse Practitioner

## 2019-11-29 ENCOUNTER — Ambulatory Visit (INDEPENDENT_AMBULATORY_CARE_PROVIDER_SITE_OTHER): Payer: Managed Care, Other (non HMO)

## 2019-11-29 VITALS — BP 124/78

## 2019-11-29 DIAGNOSIS — N950 Postmenopausal bleeding: Secondary | ICD-10-CM | POA: Diagnosis not present

## 2019-11-29 DIAGNOSIS — R9389 Abnormal findings on diagnostic imaging of other specified body structures: Secondary | ICD-10-CM | POA: Diagnosis not present

## 2019-11-29 DIAGNOSIS — D219 Benign neoplasm of connective and other soft tissue, unspecified: Secondary | ICD-10-CM

## 2019-11-29 DIAGNOSIS — N644 Mastodynia: Secondary | ICD-10-CM | POA: Diagnosis not present

## 2019-11-29 DIAGNOSIS — N939 Abnormal uterine and vaginal bleeding, unspecified: Secondary | ICD-10-CM | POA: Diagnosis not present

## 2019-11-29 DIAGNOSIS — N83201 Unspecified ovarian cyst, right side: Secondary | ICD-10-CM

## 2019-11-29 DIAGNOSIS — N6320 Unspecified lump in the left breast, unspecified quadrant: Secondary | ICD-10-CM

## 2019-11-29 NOTE — Progress Notes (Signed)
History: 49 year old G0 presents to discuss ultrasound.  Was seen by me 10/18/2019 with abnormal uterine bleeding.  She reported bleeding to her PCP on 09/17/2018 after not having a cycle for 1.5 years.  Initially started as spotting but then had 8 days of heavy bleeding with clots followed by brown discharge.  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  Had menopausal symptoms in the past but says these have improved.  [facility] 10/18/2019 was 23.7.  She did a Provera trial and experienced withdrawal bleeding therefore we moved forward with the ultrasound to evaluate endometrial thickness.  10/18/2019 she also had complaints of left breast pain that was tender to palpation and located deep and below nipple.  Diagnostic mammogram on 11/02/2019 showed a probable benign left breast mass at 11:00, 5 cm from the nipple measuring 3 x 3 x 6 mm.  She says the pain is worse and she notices it mostly with activity.  Denies shortness of breath, palpitations, nausea, indigestion.  Exam: Appears well Ultrasound: Anteverted uterus, normal size and shape, no myometrial masses.  Thickened endometrium 6.33 mm, cannot rule out endometrial masses -6 x 9 mm, 13 x 8 mm with vascular flow.  Right ovary with follicular type thin-walled cyst measuring 12 x 11 mm, avascular, echo-free.  Left ovary appears normal.  No adnexal masses, no free fluid.  Assessment:  Abnormal uterine bleeding Thickened endometrium Cyst of right ovary Pain of left breast Left breast mass  Plan: Discussed ultrasound findings to include a decrease in endometrial thickness from 11 mm to 6.33 mm after withdrawal bleeding.  Ultrasound 09/17/2019 showed probable intramural leiomyoma and endometrial masses cannot be ruled out with ultrasound today.  It is likely that she is perimenopausal but a sonohysterogram is recommended for further evaluation.  Today she  has minimal spotting but if she experiences heavy bleeding she will let me know and we will prescribe Megace to stop bleeding.  Discussed diagnostic mammogram findings and that pain correlates with small mass found on left breast with recommendations to follow-up in 6 months for repeat.  Ibuprofen as needed for pain.  Recommend monitoring when pain occurs and what makes it better to rule out other causes.

## 2019-12-19 ENCOUNTER — Encounter: Payer: Self-pay | Admitting: Obstetrics and Gynecology

## 2019-12-19 ENCOUNTER — Ambulatory Visit (INDEPENDENT_AMBULATORY_CARE_PROVIDER_SITE_OTHER): Payer: Managed Care, Other (non HMO)

## 2019-12-19 ENCOUNTER — Other Ambulatory Visit: Payer: Self-pay

## 2019-12-19 ENCOUNTER — Ambulatory Visit (INDEPENDENT_AMBULATORY_CARE_PROVIDER_SITE_OTHER): Payer: Managed Care, Other (non HMO) | Admitting: Obstetrics and Gynecology

## 2019-12-19 VITALS — BP 124/78

## 2019-12-19 DIAGNOSIS — R9389 Abnormal findings on diagnostic imaging of other specified body structures: Secondary | ICD-10-CM

## 2019-12-19 DIAGNOSIS — N882 Stricture and stenosis of cervix uteri: Secondary | ICD-10-CM | POA: Diagnosis not present

## 2019-12-19 DIAGNOSIS — D219 Benign neoplasm of connective and other soft tissue, unspecified: Secondary | ICD-10-CM

## 2019-12-19 DIAGNOSIS — N854 Malposition of uterus: Secondary | ICD-10-CM

## 2019-12-19 DIAGNOSIS — N950 Postmenopausal bleeding: Secondary | ICD-10-CM

## 2019-12-19 MED ORDER — MISOPROSTOL 200 MCG PO TABS
200.0000 ug | ORAL_TABLET | Freq: Once | ORAL | 0 refills | Status: DC
Start: 2019-12-19 — End: 2020-01-25

## 2019-12-19 NOTE — Patient Instructions (Signed)
Hysteroscopy Hysteroscopy is a procedure that is used to examine the inside of a woman's womb (uterus). This may be done for various reasons, including:  To look for lumps (tumors) and other growths in the uterus.  To evaluate abnormal bleeding, fibroid tumors, polyps, scar tissue (adhesions), or cancer of the uterus.  To determine the cause of an inability to get pregnant (infertility) or repeated losses of pregnancies (miscarriages).  To find a lost IUD (intrauterine device).  To perform a procedure that permanently prevents pregnancy (sterilization). During this procedure, a thin, flexible tube with a small light and camera (hysteroscope) is used to examine the uterus. The camera sends images to a monitor in the room so that your health care provider can view the inside of your uterus. A hysteroscopy should be done right after a menstrual period to make sure that you are not pregnant. Tell a health care provider about:  Any allergies you have.  All medicines you are taking, including vitamins, herbs, eye drops, creams, and over-the-counter medicines.  Any problems you or family members have had with the use of anesthetic medicines.  Any blood disorders you have.  Any surgeries you have had.  Any medical conditions you have.  Whether you are pregnant or may be pregnant. What are the risks? Generally, this is a safe procedure. However, problems may occur, including:  Excessive bleeding.  Infection.  Damage to the uterus or other structures or organs.  Allergic reaction to medicines or fluids that are used in the procedure. What happens before the procedure? Staying hydrated Follow instructions from your health care provider about hydration, which may include:  Up to 2 hours before the procedure - you may continue to drink clear liquids, such as water, clear fruit juice, black coffee, and plain tea. Eating and drinking restrictions Follow instructions from your health care  provider about eating and drinking, which may include:  8 hours before the procedure - stop eating solid foods and drink clear liquids only  2 hours before the procedure - stop drinking clear liquids. General instructions  Ask your health care provider about: ? Changing or stopping your normal medicines. This is important if you take diabetes medicines or blood thinners. ? Taking medicines such as aspirin and ibuprofen. These medicines can thin your blood and cause bleeding. Do not take these medicines for 1 week before your procedure, or as told by your health care provider.  Do not use any products that contain nicotine or tobacco for 2 weeks before the procedure. This includes cigarettes and e-cigarettes. If you need help quitting, ask your health care provider.  Medicine may be placed in your cervix the day before the procedure. This medicine causes the cervix to have a larger opening (dilate). The larger opening makes it easier for the hysteroscope to be inserted into the uterus during the procedure.  Plan to have someone with you for the first 24-48 hours after the procedure, especially if you are given a medicine to make you fall asleep (general anesthetic).  Plan to have someone take you home from the hospital or clinic. What happens during the procedure?  To lower your risk of infection: ? Your health care team will wash or sanitize their hands. ? Your skin will be washed with soap. ? Hair may be removed from the surgical area.  An IV tube will be inserted into one of your veins.  You may be given one or more of the following: ? A medicine to help   you relax (sedative). ? A medicine that numbs the area around the cervix (local anesthetic). ? A medicine to make you fall asleep (general anesthetic).  A hysteroscope will be inserted through your vagina and into your uterus.  Air or fluid will be used to enlarge your uterus, enabling your health care provider to see your uterus  better. The amount of fluid used will be carefully checked throughout the procedure.  In some cases, tissue may be gently scraped from inside the uterus and sent to a lab for testing (biopsy). The procedure may vary among health care providers and hospitals. What happens after the procedure?  Your blood pressure, heart rate, breathing rate, and blood oxygen level will be monitored until the medicines you were given have worn off.  You may have some cramping. You may be given medicines for this.  You may have bleeding, which varies from light spotting to menstrual-like bleeding. This is normal.  If you had a biopsy done, it is your responsibility to get the results of your procedure. Ask your health care provider, or the department performing the procedure, when your results will be ready. Summary  Hysteroscopy is a procedure that is used to examine the inside of a woman's womb (uterus).  After the procedure, you may have bleeding, which varies from light spotting to menstrual-like bleeding. This is normal. You may also have cramping.  Plan to have someone take you home from the hospital or clinic. This information is not intended to replace advice given to you by your health care provider. Make sure you discuss any questions you have with your health care provider. Document Revised: 04/08/2017 Document Reviewed: 05/25/2016 Elsevier Patient Education  2020 [employer] Postmenopausal Bleeding  Postmenopausal bleeding is any bleeding that a woman has after she has entered into menopause. Menopause is the end of a woman's fertile years. After menopause, a woman no longer ovulates and does not have menstrual periods. Postmenopausal bleeding may have various causes, including:  Menopausal hormone therapy (MHT).  Endometrial atrophy. After menopause, low estrogen hormone levels cause the membrane that lines the uterus (endometrium) to become thinner. You may have bleeding as the endometrium  thins.  Endometrial hyperplasia. This condition is caused by excess estrogen hormones and low levels of progesterone hormones. The excess estrogen causes the endometrium to thicken, which can lead to bleeding. In some cases, this can lead to cancer of the uterus.  Endometrial cancer.  Non-cancerous growths (polyps) on the endometrium, the lining of the uterus, or the cervix.  Uterine fibroids. These are non-cancerous growths in or around the uterus muscle tissue that can cause heavy bleeding. Any type of postmenopausal bleeding, even if it appears to be a typical menstrual period, should be evaluated by your health care provider. Treatment will depend on the cause of the bleeding. Follow these instructions at home:  Pay attention to any changes in your symptoms.  Avoid using tampons and douches as told by your health care provider.  Change your pads regularly.  Get regular pelvic exams and Pap tests.  Take iron supplements as told by your health care provider.  Take over-the-counter and prescription medicines only as told by your health care provider.  Keep all follow-up visits as told by your health care provider. This is important. Contact a health care provider if:  Your bleeding lasts more than 1 week.  You have abdominal pain.  You have bleeding with or after sexual intercourse.  You have bleeding that happens more often   than every 3 weeks. Get help right away if:  You have a fever, chills, headache, dizziness, muscle aches, and bleeding.  You have severe pain with bleeding.  You are passing blood clots.  You have heavy bleeding, need more than 1 pad an hour, and have never experienced this before.  You feel faint. Summary  Postmenopausal bleeding is any bleeding that a woman has after she has entered into menopause.  Postmenopausal bleeding may have various causes. Treatment will depend on the cause of the bleeding.  Any type of postmenopausal bleeding, even  if it appears to be a typical menstrual period, should be evaluated by your health care provider.  Be sure to pay attention to any changes in your symptoms and keep all follow-up visits as told by your health care provider. This information is not intended to replace advice given to you by your health care provider. Make sure you discuss any questions you have with your health care provider. Document Revised: 08/03/2017 Document Reviewed: 07/20/2016 Elsevier Patient Education  2020 Elsevier Inc.  

## 2019-12-19 NOTE — Progress Notes (Addendum)
Sherri Dalton 12-Nov-1970 [id number]  SUBJECTIVE:  49 y.o. G0P0000 female presents for a scheduled sonohysterogram to work-up postmenopausal bleeding.  She was in to see Sherri Lowenstein, NP for this issue.  She had a pelvic ultrasound 11/29/2019 which showed thickened endometrium 6.3 mm with normal size uterus 6 cm length, possibly some endometrial growths representing polyps with positive vascular flow on Doppler study.  He also had a simple right ovarian cyst 12 x 11 mm.  Sherri Dalton was 23.7 on 10/18/2019.  She indicates that she had some light bleeding that developed after not having a period for 1.5 years.  She had not been on any hormonal birth control or any other cause for the amenorrhea.  She had a positive progestin challenge test.  Some mild uterine cramping.   Current Outpatient Medications  Medication Sig Dispense Refill  . misoprostol (CYTOTEC) 200 MCG tablet Place 1 tablet (200 mcg total) vaginally once for 1 dose. Place high up in vagina near cervix night before D&C procedure 1 tablet 0   No current facility-administered medications for this visit.   Allergies: Adhesive [tape] and Bee venom  Patient's last menstrual period was 06/04/2017.  Past medical history,surgical history, problem list, medications, allergies, family history and social history were all reviewed and documented as reviewed in the EPIC chart.  ROS:  Feeling well. No dyspnea or chest pain on exertion.  No abdominal pain, change in bowel habits, black or bloody stools.  No urinary tract symptoms. GYN ROS: As described in HPI   OBJECTIVE:  BP 124/78   LMP 06/04/2017  The patient appears well, alert, oriented x 3, in no distress. PELVIC EXAM: VULVA: normal appearing vulva with no masses, tenderness or lesions, VAGINA: normal appearing vagina with normal color and discharge, no lesions, CERVIX: normal appearing cervix without discharge or lesions  Pelvic ultrasound Limited ultrasound was performed due pelvic  ultrasound just performed recently, endometrial thickness 8.64 mm with positive Doppler flow/vascularity. Anteverted uterus normal size and shape. No adnexal masses.  No free fluid.  Procedure Sonohysterogram attempt After the regular pelvic ultrasound was performed, the cervix was visualized with an open sided speculum and grasped with a single-tooth tenaculum, swabbed with a Betadine swab x2.  The cervical os finder was inserted into the external os but obstruction was met at the internal cervical os, and we were not able to get past the stenotic obstruction.  We did offer to try with a stiffer metal dilator versus proceeding to D&C under anesthesia and she prefer the latter option.  The procedure attempt was abandoned.  Chaperone: Sherri Dalton RDMS present during the examination  ASSESSMENT:  49 y.o. G0P0000 here for menopausal bleeding with thickened endometrium and suspected endometrial polyps, cervical stenosis, unable to perform [facility]  PLAN:  Discussed the possibility of abnormal tissue in the uterus including hyperplasia or carcinoma.  Also could be benign thickening.  She did go through menopause quite young.  She says she has a family history of early onset menopause. Recommend proceeding with hysteroscopy D&C in the operating room under anesthesia. I discussed the same-day outpatient intent of the procedure, and risks of infection, bleeding, possible need for blood transfusion, perforation of uterus and/or cervix resulting in injury to surrounding organ structures including major pelvic blood vessels, bowel, bladder, and potentially ureter, and DVT.  Anesthesia will either be general or MAC depending on the anesthesiologist's choice, and inherent risks with being placed under anesthesia include myocardial infarction, stroke, rarely death.  Very rarely would  laparoscopy and/or laparotomy be indicated to tend to any intra-abdominal hemorrhage and/or injury concern.  Postoperative recovery  expectations of needing a few days away from employment duties in the absence of any complication were also discussed.  We discussed there is possibility that we may not be able to get endometrial sampling as if I am not able to get past the stenotic obstruction.  I would recommend placing vaginal Cytotec 200 mcg the night before the procedure, which has been prescribed.  I provided the patient with informational pamphlet on hysteroscopy in her my chart.  I will have staff call her to coordinate the procedure.  Joseph Pierini MD 12/19/19

## 2019-12-25 ENCOUNTER — Encounter: Payer: Self-pay | Admitting: Gynecology

## 2020-01-07 ENCOUNTER — Telehealth: Payer: Self-pay

## 2020-01-07 NOTE — Telephone Encounter (Signed)
Spoke with patient and informed her that case before her's has rescheduled and I needed to move her into that 8:30am slot and she will need to check in at 6:30am at [facility].  Patient agreed.

## 2020-01-21 ENCOUNTER — Encounter (HOSPITAL_BASED_OUTPATIENT_CLINIC_OR_DEPARTMENT_OTHER): Payer: Self-pay | Admitting: Obstetrics and Gynecology

## 2020-01-21 ENCOUNTER — Other Ambulatory Visit: Payer: Self-pay

## 2020-01-21 NOTE — Progress Notes (Signed)
Spoke w/ via phone for pre-op interview---PT Lab needs dos----     TYPE AND SCREEN , URINE PREG (ASK DR KENDALL  PT POST MENOPAUSAL 05-2017 BUT ORDERED  BY DR [facility])         Lab results------NONE COVID test ------01-22-2020 AT  915  AM Arrive at -------630 AM 01-25-2020 NPO after MN NO Solid Food.  Clear liquids from MN until---530 AM THEN NPO Medications to take morning of surgery -----NONE Diabetic medication -----N/A Patient Special Instructions -----NONE Pre-Op special Istructions -----NONE Patient verbalized understanding of instructions that were given at this phone interview. Patient denies shortness of breath, chest pain, fever, cough at this phone interview.

## 2020-01-22 ENCOUNTER — Other Ambulatory Visit (HOSPITAL_COMMUNITY)
Admission: RE | Admit: 2020-01-22 | Discharge: 2020-01-22 | Disposition: A | Discharge status: Home / Self Care | Payer: Managed Care, Other (non HMO) | Source: Ambulatory Visit | Attending: Obstetrics and Gynecology | Admitting: Obstetrics and Gynecology

## 2020-01-22 DIAGNOSIS — Z20822 Contact with and (suspected) exposure to covid-19: Secondary | ICD-10-CM | POA: Diagnosis not present

## 2020-01-22 DIAGNOSIS — Z01812 Encounter for preprocedural laboratory examination: Secondary | ICD-10-CM | POA: Insufficient documentation

## 2020-01-22 LAB — SARS CORONAVIRUS 2 (TAT 6-24 HRS): SARS Coronavirus 2: NEGATIVE

## 2020-01-25 ENCOUNTER — Ambulatory Visit (HOSPITAL_BASED_OUTPATIENT_CLINIC_OR_DEPARTMENT_OTHER): Payer: Managed Care, Other (non HMO) | Admitting: Anesthesiology

## 2020-01-25 ENCOUNTER — Encounter (HOSPITAL_BASED_OUTPATIENT_CLINIC_OR_DEPARTMENT_OTHER): Payer: Self-pay | Admitting: Obstetrics and Gynecology

## 2020-01-25 ENCOUNTER — Encounter (HOSPITAL_BASED_OUTPATIENT_CLINIC_OR_DEPARTMENT_OTHER)
Admission: RE | Disposition: A | Discharge status: Home / Self Care | Payer: Self-pay | Source: Home / Self Care | Attending: Obstetrics and Gynecology

## 2020-01-25 ENCOUNTER — Ambulatory Visit (HOSPITAL_BASED_OUTPATIENT_CLINIC_OR_DEPARTMENT_OTHER)
Admission: RE | Admit: 2020-01-25 | Discharge: 2020-01-25 | Disposition: A | Discharge status: Home / Self Care | Payer: Managed Care, Other (non HMO) | Attending: Obstetrics and Gynecology | Admitting: Obstetrics and Gynecology

## 2020-01-25 DIAGNOSIS — M1990 Unspecified osteoarthritis, unspecified site: Secondary | ICD-10-CM | POA: Diagnosis not present

## 2020-01-25 DIAGNOSIS — N950 Postmenopausal bleeding: Secondary | ICD-10-CM | POA: Insufficient documentation

## 2020-01-25 DIAGNOSIS — N840 Polyp of corpus uteri: Secondary | ICD-10-CM

## 2020-01-25 DIAGNOSIS — F17210 Nicotine dependence, cigarettes, uncomplicated: Secondary | ICD-10-CM | POA: Insufficient documentation

## 2020-01-25 HISTORY — DX: Abnormal findings on diagnostic imaging of other specified body structures: R93.89

## 2020-01-25 HISTORY — PX: DILATATION & CURETTAGE/HYSTEROSCOPY WITH MYOSURE: SHX6511

## 2020-01-25 HISTORY — DX: Dermatitis, unspecified: L30.9

## 2020-01-25 SURGERY — DILATATION & CURETTAGE/HYSTEROSCOPY WITH MYOSURE
Anesthesia: General | Site: Uterus

## 2020-01-25 MED ORDER — DEXAMETHASONE SODIUM PHOSPHATE 10 MG/ML IJ SOLN
INTRAMUSCULAR | Status: AC
  Filled 2020-01-25: qty 1

## 2020-01-25 MED ORDER — BUPIVACAINE HCL 0.25 % IJ SOLN
10.0000 mL | Freq: Once | INTRAMUSCULAR | Status: DC
  Filled 2020-01-25: qty 10

## 2020-01-25 MED ORDER — ONDANSETRON HCL 4 MG/2ML IJ SOLN
INTRAMUSCULAR | Status: AC
  Filled 2020-01-25: qty 2

## 2020-01-25 MED ORDER — SODIUM CHLORIDE 0.9 % IV SOLN: 250.0000 mL | INTRAVENOUS | Status: DC | PRN

## 2020-01-25 MED ORDER — LACTATED RINGERS IV SOLN: INTRAVENOUS | Status: DC

## 2020-01-25 MED ORDER — OXYCODONE HCL 5 MG PO TABS: 5.0000 mg | ORAL_TABLET | ORAL | Status: DC | PRN

## 2020-01-25 MED ORDER — FENTANYL CITRATE (PF) 100 MCG/2ML IJ SOLN
INTRAMUSCULAR | Status: AC
  Filled 2020-01-25: qty 2

## 2020-01-25 MED ORDER — PROPOFOL 10 MG/ML IV BOLUS
INTRAVENOUS | Status: AC
  Filled 2020-01-25: qty 20

## 2020-01-25 MED ORDER — DEXAMETHASONE SODIUM PHOSPHATE 10 MG/ML IJ SOLN
INTRAMUSCULAR | Status: DC | PRN
  Administered 2020-01-25: 10 mg via INTRAVENOUS

## 2020-01-25 MED ORDER — FENTANYL CITRATE (PF) 100 MCG/2ML IJ SOLN
25.0000 ug | INTRAMUSCULAR | Status: DC | PRN
  Administered 2020-01-25 (×3): 25 ug via INTRAVENOUS

## 2020-01-25 MED ORDER — MIDAZOLAM HCL 5 MG/5ML IJ SOLN
INTRAMUSCULAR | Status: DC | PRN
  Administered 2020-01-25: 2 mg via INTRAVENOUS

## 2020-01-25 MED ORDER — ACETAMINOPHEN 500 MG PO TABS: 1000.0000 mg | ORAL_TABLET | Freq: Four times a day (QID) | ORAL | Status: DC

## 2020-01-25 MED ORDER — ONDANSETRON HCL 4 MG/2ML IJ SOLN: 4.0000 mg | Freq: Once | INTRAMUSCULAR | Status: DC | PRN

## 2020-01-25 MED ORDER — ACETAMINOPHEN 500 MG PO TABS: 1000.0000 mg | ORAL_TABLET | Freq: Four times a day (QID) | ORAL | Status: AC | PRN

## 2020-01-25 MED ORDER — SODIUM CHLORIDE 0.9% FLUSH: 3.0000 mL | Freq: Two times a day (BID) | INTRAVENOUS | Status: DC

## 2020-01-25 MED ORDER — KETOROLAC TROMETHAMINE 30 MG/ML IJ SOLN: 30.0000 mg | Freq: Once | INTRAMUSCULAR | Status: DC | PRN

## 2020-01-25 MED ORDER — MIDAZOLAM HCL 2 MG/2ML IJ SOLN
INTRAMUSCULAR | Status: AC
  Filled 2020-01-25: qty 2

## 2020-01-25 MED ORDER — KETOROLAC TROMETHAMINE 15 MG/ML IJ SOLN
INTRAMUSCULAR | Status: AC
  Filled 2020-01-25: qty 1

## 2020-01-25 MED ORDER — ACETAMINOPHEN 325 MG RE SUPP: 650.0000 mg | RECTAL | Status: DC | PRN

## 2020-01-25 MED ORDER — KETOROLAC TROMETHAMINE 15 MG/ML IJ SOLN
15.0000 mg | Freq: Once | INTRAMUSCULAR | Status: AC
  Administered 2020-01-25: 15 mg via INTRAVENOUS

## 2020-01-25 MED ORDER — ONDANSETRON HCL 4 MG/2ML IJ SOLN
INTRAMUSCULAR | Status: DC | PRN
  Administered 2020-01-25: 4 mg via INTRAVENOUS

## 2020-01-25 MED ORDER — PROPOFOL 10 MG/ML IV BOLUS
INTRAVENOUS | Status: DC | PRN
  Administered 2020-01-25: 180 mg via INTRAVENOUS

## 2020-01-25 MED ORDER — IBUPROFEN 200 MG PO TABS: 600.0000 mg | ORAL_TABLET | Freq: Four times a day (QID) | ORAL | Status: AC

## 2020-01-25 MED ORDER — ACETAMINOPHEN 325 MG PO TABS: 650.0000 mg | ORAL_TABLET | ORAL | Status: DC | PRN

## 2020-01-25 MED ORDER — SODIUM CHLORIDE 0.9% FLUSH: 3.0000 mL | INTRAVENOUS | Status: DC | PRN

## 2020-01-25 MED ORDER — LIDOCAINE 2% (20 MG/ML) 5 ML SYRINGE
INTRAMUSCULAR | Status: DC | PRN
  Administered 2020-01-25: 80 mg via INTRAVENOUS

## 2020-01-25 MED ORDER — BUPIVACAINE HCL (PF) 0.25 % IJ SOLN
INTRAMUSCULAR | Status: DC | PRN
  Administered 2020-01-25: 10 mL

## 2020-01-25 MED ORDER — POVIDONE-IODINE 10 % EX SWAB: 2.0000 "application " | Freq: Once | CUTANEOUS | Status: DC

## 2020-01-25 MED ORDER — LIDOCAINE 2% (20 MG/ML) 5 ML SYRINGE
INTRAMUSCULAR | Status: AC
  Filled 2020-01-25: qty 5

## 2020-01-25 MED ORDER — FENTANYL CITRATE (PF) 100 MCG/2ML IJ SOLN
INTRAMUSCULAR | Status: DC | PRN
  Administered 2020-01-25: 50 ug via INTRAVENOUS
  Administered 2020-01-25 (×2): 25 ug via INTRAVENOUS

## 2020-01-25 MED ORDER — SODIUM CHLORIDE 0.9 % IR SOLN
Status: DC | PRN
  Administered 2020-01-25: 3000 mL

## 2020-01-25 SURGICAL SUPPLY — 21 items
CATH ROBINSON RED A/P 16FR (CATHETERS) ×2 IMPLANT
COVER WAND RF STERILE (DRAPES) ×2 IMPLANT
DEVICE MYOSURE LITE (MISCELLANEOUS) IMPLANT
DEVICE MYOSURE REACH (MISCELLANEOUS) ×2 IMPLANT
DILATOR CANAL MILEX (MISCELLANEOUS) IMPLANT
ELECT REM PT RETURN 9FT ADLT (ELECTROSURGICAL)
ELECTRODE REM PT RTRN 9FT ADLT (ELECTROSURGICAL) IMPLANT
GAUZE 4X4 16PLY RFD (DISPOSABLE) ×2 IMPLANT
GLOVE BIO SURGEON STRL SZ8 (GLOVE) ×2 IMPLANT
GLOVE BIOGEL PI IND STRL 8 (GLOVE) ×2 IMPLANT
GLOVE BIOGEL PI INDICATOR 8 (GLOVE) ×2
GOWN STRL REUS W/TWL XL LVL3 (GOWN DISPOSABLE) ×2 IMPLANT
IV NS IRRIG 3000ML ARTHROMATIC (IV SOLUTION) ×4 IMPLANT
KIT PROCEDURE FLUENT (KITS) ×2 IMPLANT
MYOSURE XL FIBROID (MISCELLANEOUS)
PACK VAGINAL MINOR WOMEN LF (CUSTOM PROCEDURE TRAY) ×2 IMPLANT
PAD OB MATERNITY 4.3X12.25 (PERSONAL CARE ITEMS) ×2 IMPLANT
PAD PREP 24X48 CUFFED NSTRL (MISCELLANEOUS) ×2 IMPLANT
SEAL CERVICAL OMNI LOK (ABLATOR) IMPLANT
SEAL ROD LENS SCOPE MYOSURE (ABLATOR) ×2 IMPLANT
SYSTEM TISS REMOVAL MYOSURE XL (MISCELLANEOUS) IMPLANT

## 2020-01-25 NOTE — Anesthesia Postprocedure Evaluation (Signed)
Anesthesia Post Note  Patient: Sherri Dalton  Procedure(s) Performed: DILATATION & CURETTAGE/HYSTEROSCOPY WITH  MYOSURE POLYPECTOMY (N/A Uterus)     Patient location during evaluation: PACU Anesthesia Type: General Level of consciousness: awake and alert Pain management: pain level controlled Vital Signs Assessment: post-procedure vital signs reviewed and stable Respiratory status: spontaneous breathing, nonlabored ventilation, respiratory function stable and patient connected to nasal cannula oxygen Cardiovascular status: blood pressure returned to baseline and stable Postop Assessment: no apparent nausea or vomiting Anesthetic complications: no   No complications documented.  Last Vitals:  Vitals:   01/25/20 1015 01/25/20 1055  BP: 127/83 128/80  Pulse: (!) 57 (!) 53  Resp: 14 15  Temp:  (!) 36.3 C  SpO2: 98% 97%    Last Pain:  Vitals:   01/25/20 1145  TempSrc:   PainSc: 0-No pain                 Ezra Marquess S

## 2020-01-25 NOTE — Discharge Instructions (Signed)
DISCHARGE INSTRUCTIONS: D&C / D&E The following instructions have been prepared to help you care for yourself upon your return home.   Personal hygiene: Marland Kitchen Use sanitary pads for vaginal drainage, not tampons. . Shower the day after your procedure. . NO tub baths, pools or Jacuzzis for 2-3 weeks. . Wipe front to back after using the bathroom.  Activity and limitations: . Do NOT drive or operate any equipment for 24 hours. The effects of anesthesia are still present and drowsiness may result. . Do NOT rest in bed all day. . Walking is encouraged. . Walk up and down stairs slowly. . You may resume your normal activity in one to two days or as indicated by your physician.  Sexual activity: NO intercourse for at least 2 weeks after the procedure, or as indicated by your physician.  Diet: Eat a light meal as desired this evening. You may resume your usual diet tomorrow.  Return to work: You may resume your work activities in one to two days or as indicated by your doctor.  What to expect after your surgery: Expect to have vaginal bleeding/discharge for 2-3 days and spotting for up to 10 days. It is not unusual to have soreness for up to 1-2 weeks. You may have a slight burning sensation when you urinate for the first day. Mild cramps may continue for a couple of days. You may have a regular period in 2-6 weeks.  Call your doctor for any of the following: . Excessive vaginal bleeding, saturating and changing one pad every hour. . Inability to urinate 6 hours after discharge from hospital. . Pain not relieved by pain medication. . Fever of 100.4 F or greater. . Unusual vaginal discharge or odor.   Call for an appointment:    Post Anesthesia Home Care Instructions  Activity: Get plenty of rest for the remainder of the day. A responsible individual must stay with you for 24 hours following the procedure.  For the next 24 hours, DO NOT: -Drive a car -Paediatric nurse -Drink alcoholic  beverages -Take any medication unless instructed by your physician -Make any legal decisions or sign important papers.  Meals: Start with liquid foods such as gelatin or soup. Progress to regular foods as tolerated. Avoid greasy, spicy, heavy foods. If nausea and/or vomiting occur, drink only clear liquids until the nausea and/or vomiting subsides. Call your physician if vomiting continues.  Special Instructions/Symptoms: Your throat may feel dry or sore from the anesthesia or the breathing tube placed in your throat during surgery. If this causes discomfort, gargle with warm salt water. The discomfort should disappear within 24 hours.

## 2020-01-25 NOTE — H&P (Signed)
   REAGEN GOATES 24-Feb-1971 MRN: [id number]  HPI The patient is a 49 y.o. G0P0000 who presents today for scheduled hysteroscopy D&C for AUB with thickened endometrium and suspected endometrial polyps, cervical stenosis, and in ability to perform SHGM.  She did take the vaginal cytotec last night and has some vaginal bleeding.  No changes to her medical history since her pre op exam, denies CP, SOB, fever/chills, dysuria.  Past Medical History:  Diagnosis Date  . Anxiety   . Arthritis   . Chronic diarrhea   . Depression   . Eczema   . Endometrial thickening on ultrasound   . GERD (gastroesophageal reflux disease)    NO MEDS TAKEN  . Hard of hearing    left ear  . Hyperlipidemia   . Indigestion   . Irritable bowel   . Pancreatitis 2013   NONE SINCE    Past Surgical History:  Procedure Laterality Date  . ANTERIOR CRUCIATE LIGAMENT REPAIR  yrs ago   left leg  . CHOLECYSTECTOMY  09/02/2011   Procedure: LAPAROSCOPIC CHOLECYSTECTOMY WITH INTRAOPERATIVE CHOLANGIOGRAM;  Surgeon: Earnstine Regal, MD;  Location: [facility]S;  Service: General;  Laterality: N/A;  Lap Chole with IOC    Allergies  Allergen Reactions  . Adhesive [Tape] Other (See Comments)    redness  . Bee Venom Swelling    NOSE BLEEDS     No current facility-administered medications on file prior to encounter.   Current Outpatient Medications on File Prior to Encounter  Medication Sig Dispense Refill  . fluticasone (FLONASE) 50 MCG/ACT nasal spray Place 2 sprays into both nostrils daily as needed for allergies or rhinitis.    . misoprostol (CYTOTEC) 200 MCG tablet Place 1 tablet (200 mcg total) vaginally once for 1 dose. Place high up in vagina near cervix night before D&C procedure 1 tablet 0  . UNABLE TO FIND ZXUIL PRN AT HS        Physical Exam   BP 119/76   Pulse 62   Temp 98.6 F (37 C) (Oral)   Resp 18   Ht 5\' 4"  (1.626 m)   Wt 80.9 kg   LMP 06/04/2017   SpO2 100%   BMI 30.62 kg/m    General: Pleasant  female, no acute distress, alert and oriented CV: RRR, no murmurs Pulm: good respiratory effort, CTAB     Plan Proceed with hysteroscopy D&C as planned.  All questions answered and the patient agrees to proceed.   Joseph Pierini, MD 01/25/20

## 2020-01-25 NOTE — Progress Notes (Signed)
After VS Patient ambulated to bathroom then to phase II.

## 2020-01-25 NOTE — Op Note (Signed)
Name: Sherri Dalton  Age: 49 y.o.  Date of Birth: 06-24-1970 Medical Record #: [id number]   Operative Note   Preoperative Diagnosis: Postmenopausal bleeding, suspected endometrial polyps, thickened endometrium, cervical stenosis, inability to perform sonohysterogram/biopsy in office Procedure: Hysteroscopy, Dilatation and Curettage, MyoSure Polypectomy Postoperative Diagnosis: same Surgeon: Joseph Pierini, MD Estimated Blood Loss: 5 mL Anesthesia: General LMA, local with 0.25% bupivacaine (10 mL) Specimens: Endometrial polyp, endometrial resection tissue Findings: Minimal stenosis of external os (patient had taken Cytotec intravaginally the night before the procedure), multiple polyps visualized within the endometrium, largest measuring at least 3 cm in length and appearing necrotic.  Other smaller polyps are present at multiple attachment points throughout the endometrial cavity.  Background of mostly atrophic appearing endometrium.  Normal endometrial cavity with bilateral fallopian tube ostia visualized.  Normal cavity contour without obvious submucosal fibroids.  Uterus sounds to 7 cm.  Uterus feels normal in size and contour, mobile, midplane.  Hysteroscopic fluid deficit 305 mL.  Complication: none. Date: 01/25/20     DESCRIPTION OF PROCEDURE:      Preoperative review of the procedure was completed with the patient prior to transport to the operating room including risks, benefits, and alternatives.  The patient's questions were answered and she agreed to proceed.    Under anesthesia, Sherri Dalton was placed in the dorsolithotomy position with legs in yellowfin stirrups with SCDs applied and running.  A surgical team time out was performed to verify and agree on procedure and patient consent. A bimanual exam was performed.  The patient was prepped and draped in the usual sterile fashion.    The bladder was drained using aseptic technique and a red Robinson catheter.    Cervix was  visualized with a weighted speculum placed in the posterior vagina and a Sims retractor anteriorly.  A paracervical block was applied in the standard fashion using 0.25% Marcaine. The anterior cervix was grasped with a single-toothed tenaculum. The uterine descensus was noted to be moderate.  Cervix was gently dilated using a progressive series of Pratt dilators to size 17, and the mild stenosis was easily passed through with the dilator without concern for cervical perforation.  The uterine cavity was very gently sounded to establish a measurement of uterine cavity depth.  The MyoSure hysteroscope was introduced and the uterine cavity and ostia were visualized.    Findings were as noted above.   The MyoSure Reach was then inserted through the operative port channel of the hysteroscope.  Under direct visualization the MyoSure was used to transect the attachment in the lower uterine segment of the large necrotic polyp and the polyp was then freed and it migrated down to the endocervical canal.  The hysteroscope was removed and the polyp was present at the external os and the polyp was grasped with a Blake forceps and collected to be sent to pathology as its own specimen.  The hysteroscope and MyoSure were then reinserted into the endometrial cavity and resection of the remaining visible polyps was performed without any concern for uterine perforation.  All tissue from this endometrial resection was collected and was sent to pathology as a separate specimen.  The hysteroscope was removed.  The cervix was gently dilated a second time to size 19 allow introduction of a #2 curettage instrument.  A thorough curettage was productive of scant endometrial tissue. The curettage was effective at achieving a uterine cry circumferentially.  The endometrial curettings were sent together as a single specimen with the endometrial  resection tissue from the MyoSure sampling.  The hysteroscope was introduced into the endometrial  cavity one final time to ensure adequate sampling of the endometrium.  Bleeding was minimal at that point.  The tenaculum was removed from the cervix and the puncture sites were hemostatic.   Sponge and instrument counts were correct at the conclusion of the procedure. The patient tolerated the procedure well and was brought to the recovery room in stable condition.      Joseph Pierini, M.D., Cherlynn June

## 2020-01-25 NOTE — Transfer of Care (Signed)
Immediate Anesthesia Transfer of Care Note  Patient: Sherri Dalton  Procedure(s) Performed: DILATATION & CURETTAGE/HYSTEROSCOPY WITH  MYOSURE POLYPECTOMY (N/A Uterus)  Patient Location: PACU  Anesthesia Type:General  Level of Consciousness: awake, alert  and oriented  Airway & Oxygen Therapy: Patient Spontanous Breathing and Patient connected to nasal cannula oxygen  Post-op Assessment: Report given to RN and Post -op Vital signs reviewed and stable  Post vital signs: Reviewed and stable  Last Vitals:  Vitals Value Taken Time  BP    Temp    Pulse    Resp    SpO2      Last Pain:  Vitals:   01/25/20 0647  TempSrc: Oral  PainSc: 1       Patients Stated Pain Goal: 5 (23/93/59 4090)  Complications: No complications documented.

## 2020-01-25 NOTE — Anesthesia Preprocedure Evaluation (Signed)
Anesthesia Evaluation  Patient identified by MRN, date of birth, ID band Patient awake    Reviewed: Allergy & Precautions, NPO status , Patient's Chart, lab work & pertinent test results  Airway Mallampati: II  TM Distance: >3 FB Neck ROM: Full    Dental no notable dental hx.    Pulmonary neg pulmonary ROS, Current Smoker and Patient abstained from smoking.,    Pulmonary exam normal breath sounds clear to auscultation       Cardiovascular negative cardio ROS Normal cardiovascular exam Rhythm:Regular Rate:Normal     Neuro/Psych negative neurological ROS  negative psych ROS   GI/Hepatic Neg liver ROS, GERD  ,  Endo/Other  negative endocrine ROS  Renal/GU negative Renal ROS  negative genitourinary   Musculoskeletal negative musculoskeletal ROS (+)   Abdominal   Peds negative pediatric ROS (+)  Hematology negative hematology ROS (+)   Anesthesia Other Findings   Reproductive/Obstetrics negative OB ROS                             Anesthesia Physical Anesthesia Plan  ASA: II  Anesthesia Plan: General   Post-op Pain Management:    Induction: Intravenous  PONV Risk Score and Plan: 2 and Ondansetron, Dexamethasone and Treatment may vary due to age or medical condition  Airway Management Planned: LMA  Additional Equipment:   Intra-op Plan:   Post-operative Plan: Extubation in OR  Informed Consent: I have reviewed the patients History and Physical, chart, labs and discussed the procedure including the risks, benefits and alternatives for the proposed anesthesia with the patient or authorized representative who has indicated his/her understanding and acceptance.     Dental advisory given  Plan Discussed with: CRNA and Surgeon  Anesthesia Plan Comments:         Anesthesia Quick Evaluation

## 2020-01-25 NOTE — Anesthesia Procedure Notes (Signed)
Procedure Name: LMA Insertion Date/Time: 01/25/2020 8:32 AM Performed by: Luccas Towell D, CRNA Pre-anesthesia Checklist: Patient identified, Emergency Drugs available, Suction available and Patient being monitored Patient Re-evaluated:Patient Re-evaluated prior to induction Oxygen Delivery Method: Circle system utilized Preoxygenation: Pre-oxygenation with 100% oxygen Induction Type: IV induction Ventilation: Mask ventilation without difficulty LMA: LMA inserted LMA Size: 4.0 Tube type: Oral Number of attempts: 1 Placement Confirmation: positive ETCO2 and breath sounds checked- equal and bilateral Tube secured with: Tape Dental Injury: Teeth and Oropharynx as per pre-operative assessment

## 2020-01-28 ENCOUNTER — Encounter (HOSPITAL_BASED_OUTPATIENT_CLINIC_OR_DEPARTMENT_OTHER): Payer: Self-pay | Admitting: Obstetrics and Gynecology

## 2020-01-28 LAB — SURGICAL PATHOLOGY

## 2020-05-05 ENCOUNTER — Other Ambulatory Visit: Payer: Managed Care, Other (non HMO)

## 2020-11-11 IMAGING — US US BREAST*L* LIMITED INC AXILLA
1 series · 6 of 6 positions shown · non-contrast
Comparison: None.

CLINICAL DATA: Diffuse lateral left breast pain radiating to the
nipple. No focal lumps

EXAM:
DIGITAL DIAGNOSTIC BILATERAL MAMMOGRAM WITH CAD AND TOMO
ULTRASOUND BILATERAL BREAST

[Series 1: us breast*left* limited inc axilla · 0.06mm/px · 6 of 6 slices shown]
[im 1/6]
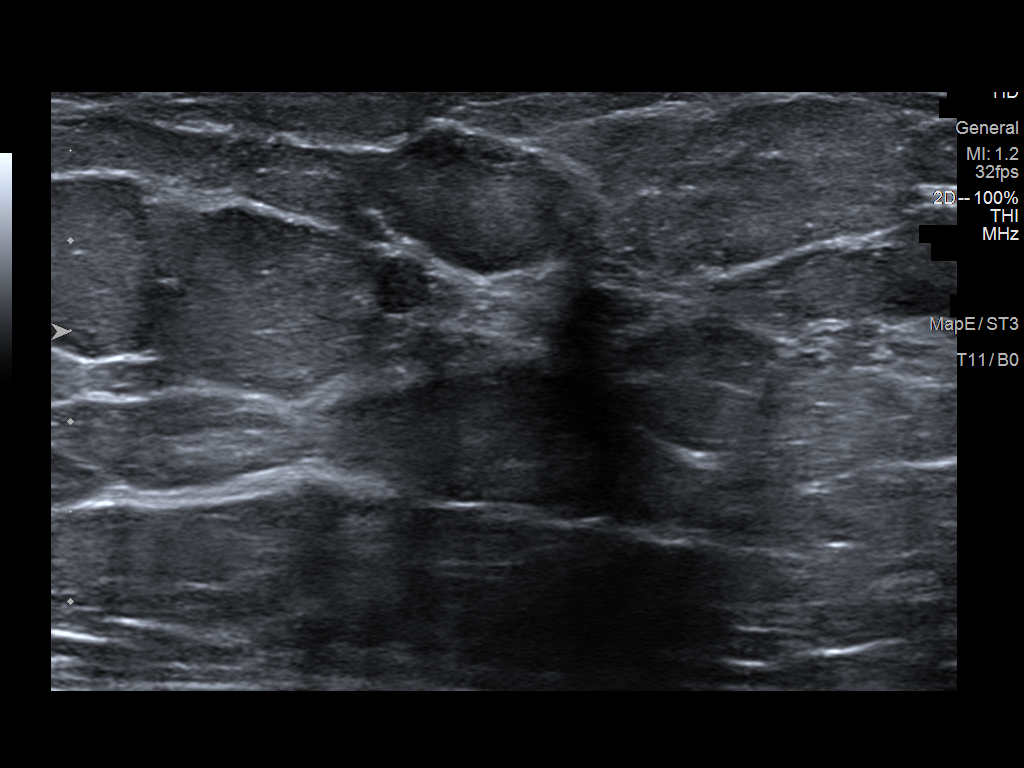
[im 2/6]
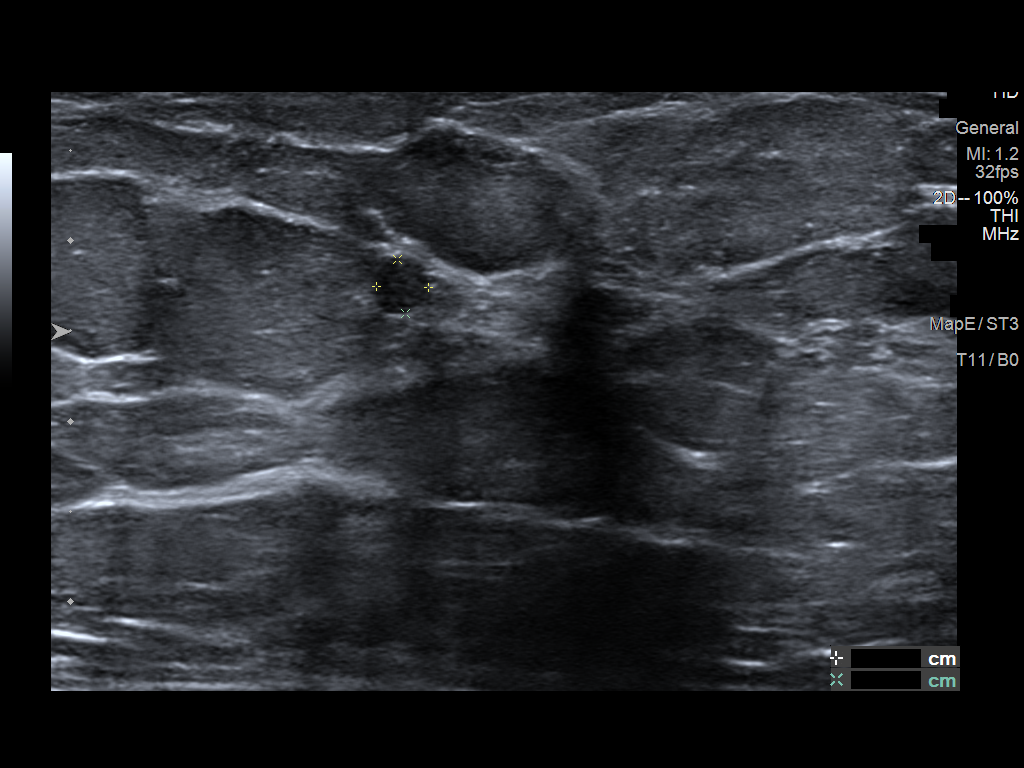
[im 3/6]
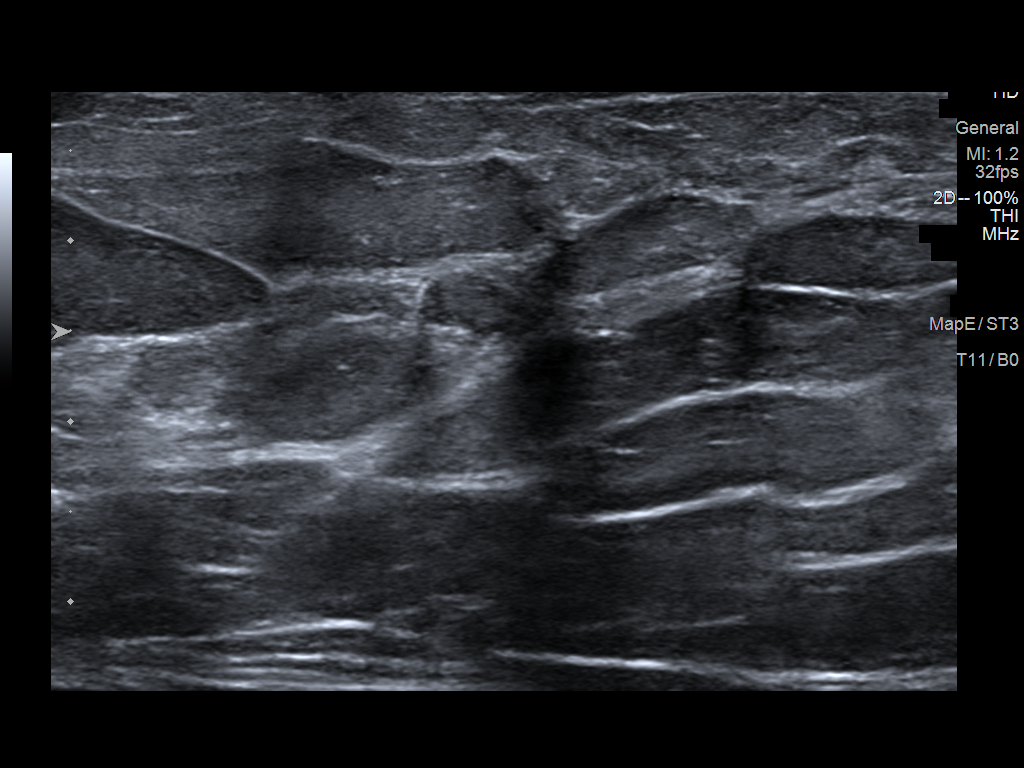
[im 4/6]
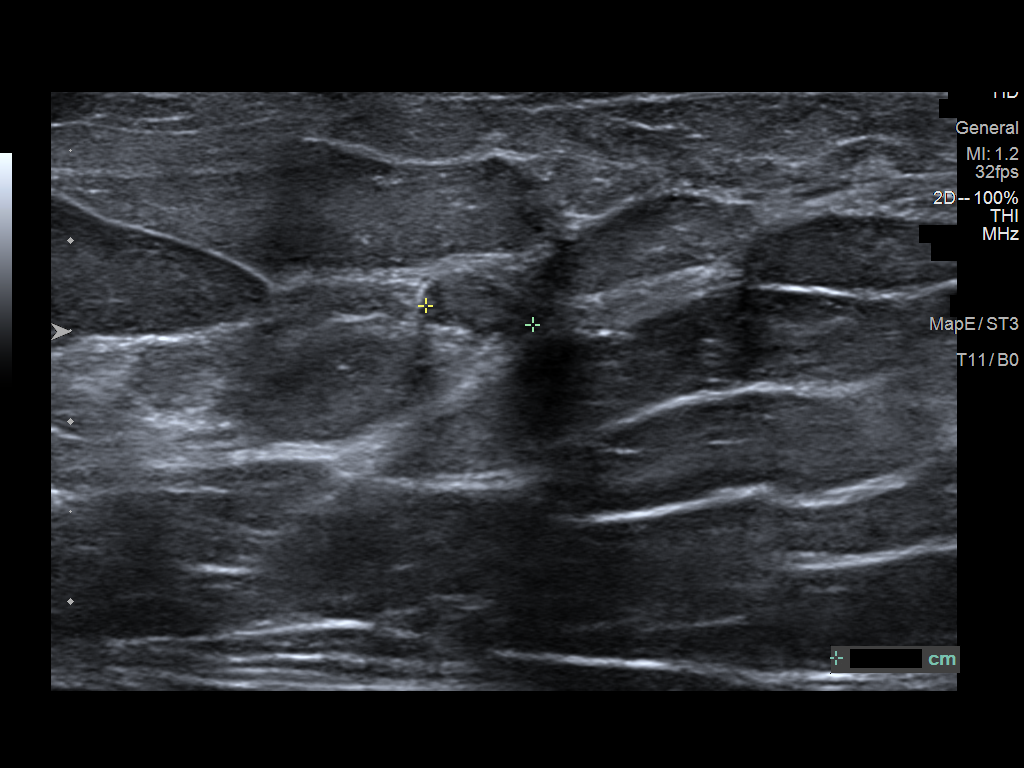
[im 5/6]
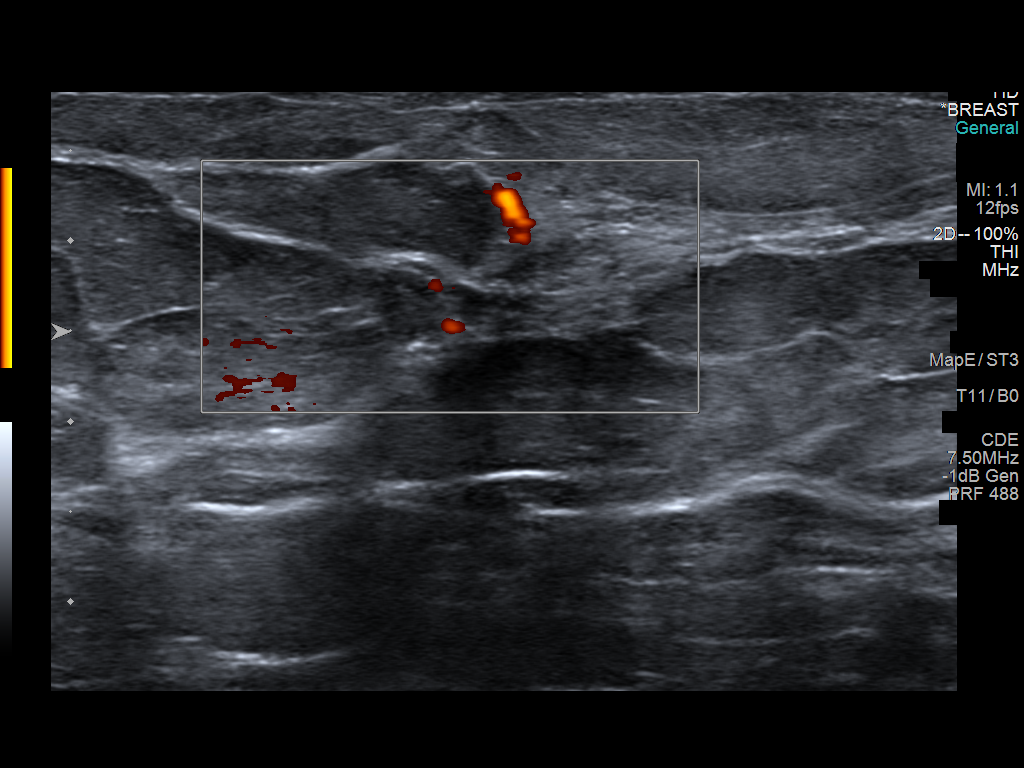
[im 6/6]
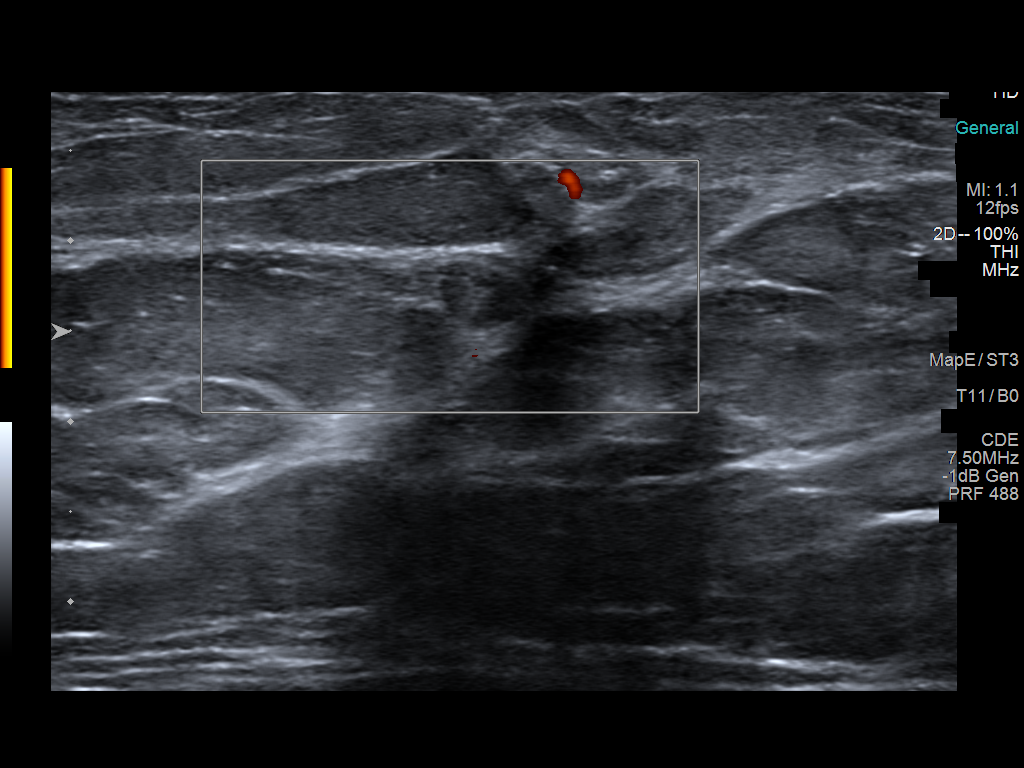

[6 of 6 positions shown; findings below may reference images not displayed]

ACR Breast Density Category b: There are scattered areas of
fibroglandular density.
FINDINGS: There is an oval asymmetry in the lateral right breast persists on
spot imaging measuring 5 mm. There is a mass in the superior central
left breast. No other suspicious mammographic findings.

Mammographic images were processed with CAD.

On physical exam, no suspicious lumps are identified.

Targeted ultrasound is performed, showing a mass in the left breast
at 11 o'clock, 5 cm from the nipple measuring 3 x 3 x 6 mm,
correlating with the mammographic finding. No sonographic
abnormalities are identified on right.
IMPRESSION: Probably benign left breast mass. Probably benign right breast
asymmetry/mass. No other suspicious findings.

RECOMMENDATION:
Recommend six-month follow-up right mammography and left breast
ultrasound follow the probably benign findings. Treatment of the
patient's diffuse left breast pain should be based on clinical and
physical exam given lack of imaging findings.

I have discussed the findings and recommendations with the patient.
If applicable, a reminder letter will be sent to the patient
regarding the next appointment.

BI-RADS CATEGORY  3: Probably benign.
